# Patient Record
Sex: Female | Born: 1950 | Race: White | Hispanic: No | Smoking: Current every day smoker
Health system: Southern US, Community
[De-identification: ages and names within clinical notes are randomized; demographics above are authoritative.]

## PROBLEM LIST (undated history)

## (undated) DIAGNOSIS — F419 Anxiety disorder, unspecified: Secondary | ICD-10-CM

## (undated) DIAGNOSIS — M810 Age-related osteoporosis without current pathological fracture: Secondary | ICD-10-CM

## (undated) DIAGNOSIS — M791 Myalgia, unspecified site: Secondary | ICD-10-CM

## (undated) DIAGNOSIS — E785 Hyperlipidemia, unspecified: Secondary | ICD-10-CM

## (undated) DIAGNOSIS — J189 Pneumonia, unspecified organism: Secondary | ICD-10-CM

## (undated) DIAGNOSIS — M1990 Unspecified osteoarthritis, unspecified site: Secondary | ICD-10-CM

## (undated) DIAGNOSIS — I10 Essential (primary) hypertension: Secondary | ICD-10-CM

## (undated) DIAGNOSIS — K589 Irritable bowel syndrome without diarrhea: Secondary | ICD-10-CM

## (undated) DIAGNOSIS — K8020 Calculus of gallbladder without cholecystitis without obstruction: Secondary | ICD-10-CM

## (undated) DIAGNOSIS — M542 Cervicalgia: Secondary | ICD-10-CM

## (undated) DIAGNOSIS — G8929 Other chronic pain: Secondary | ICD-10-CM

## (undated) HISTORY — DX: Cervicalgia: M54.2

## (undated) HISTORY — PX: BLADDER SURGERY: SHX569

## (undated) HISTORY — DX: Pneumonia, unspecified organism: J18.9

## (undated) HISTORY — PX: CHOLECYSTECTOMY: SHX55

## (undated) HISTORY — DX: Age-related osteoporosis without current pathological fracture: M81.0

## (undated) HISTORY — DX: Essential (primary) hypertension: I10

## (undated) HISTORY — DX: Hyperlipidemia, unspecified: E78.5

## (undated) HISTORY — DX: Unspecified osteoarthritis, unspecified site: M19.90

## (undated) HISTORY — PX: VAGINAL HYSTERECTOMY: SUR661

## (undated) HISTORY — DX: Other chronic pain: G89.29

## (undated) HISTORY — DX: Anxiety disorder, unspecified: F41.9

## (undated) HISTORY — DX: Myalgia, unspecified site: M79.10

## (undated) HISTORY — DX: Calculus of gallbladder without cholecystitis without obstruction: K80.20

## (undated) HISTORY — DX: Irritable bowel syndrome, unspecified: K58.9

---

## 1998-05-17 ENCOUNTER — Observation Stay (HOSPITAL_COMMUNITY): Admission: EM | Admit: 1998-05-17 | Discharge: 1998-05-18 | Payer: Self-pay | Admitting: Emergency Medicine

## 1998-11-29 ENCOUNTER — Ambulatory Visit (HOSPITAL_COMMUNITY): Admission: RE | Admit: 1998-11-29 | Discharge: 1998-11-29 | Payer: Self-pay | Admitting: Gastroenterology

## 1998-11-29 ENCOUNTER — Encounter: Payer: Self-pay | Admitting: Gastroenterology

## 1999-01-15 ENCOUNTER — Encounter: Payer: Self-pay | Admitting: Emergency Medicine

## 1999-01-15 ENCOUNTER — Inpatient Hospital Stay (HOSPITAL_COMMUNITY): Admission: EM | Admit: 1999-01-15 | Discharge: 1999-01-17 | Payer: Self-pay | Admitting: Emergency Medicine

## 1999-01-16 ENCOUNTER — Encounter: Payer: Self-pay | Admitting: Internal Medicine

## 1999-05-16 ENCOUNTER — Ambulatory Visit (HOSPITAL_COMMUNITY): Admission: RE | Admit: 1999-05-16 | Discharge: 1999-05-16 | Payer: Self-pay | Admitting: Neurology

## 1999-07-02 ENCOUNTER — Encounter: Admission: RE | Admit: 1999-07-02 | Discharge: 1999-09-30 | Payer: Self-pay | Admitting: *Deleted

## 1999-10-02 ENCOUNTER — Encounter: Admission: RE | Admit: 1999-10-02 | Discharge: 1999-12-31 | Payer: Self-pay | Admitting: *Deleted

## 1999-12-31 ENCOUNTER — Encounter: Admission: RE | Admit: 1999-12-31 | Discharge: 2000-01-30 | Payer: Self-pay | Admitting: *Deleted

## 2000-12-29 ENCOUNTER — Encounter: Payer: Self-pay | Admitting: General Surgery

## 2000-12-29 ENCOUNTER — Encounter: Admission: RE | Admit: 2000-12-29 | Discharge: 2000-12-29 | Payer: Self-pay | Admitting: General Surgery

## 2001-03-09 ENCOUNTER — Other Ambulatory Visit: Admission: RE | Admit: 2001-03-09 | Discharge: 2001-03-09 | Payer: Self-pay | Admitting: Obstetrics and Gynecology

## 2002-05-05 ENCOUNTER — Encounter (INDEPENDENT_AMBULATORY_CARE_PROVIDER_SITE_OTHER): Payer: Self-pay

## 2002-05-05 ENCOUNTER — Ambulatory Visit (HOSPITAL_COMMUNITY): Admission: RE | Admit: 2002-05-05 | Discharge: 2002-05-05 | Payer: Self-pay | Admitting: Gastroenterology

## 2002-07-25 ENCOUNTER — Ambulatory Visit (HOSPITAL_COMMUNITY): Admission: RE | Admit: 2002-07-25 | Discharge: 2002-07-25 | Payer: Self-pay | Admitting: Specialist

## 2002-07-25 ENCOUNTER — Encounter: Payer: Self-pay | Admitting: Specialist

## 2002-10-05 ENCOUNTER — Ambulatory Visit (HOSPITAL_COMMUNITY): Admission: RE | Admit: 2002-10-05 | Discharge: 2002-10-05 | Payer: Self-pay | Admitting: Gastroenterology

## 2002-10-05 ENCOUNTER — Encounter: Payer: Self-pay | Admitting: Gastroenterology

## 2003-11-01 ENCOUNTER — Ambulatory Visit (HOSPITAL_COMMUNITY): Admission: RE | Admit: 2003-11-01 | Discharge: 2003-11-02 | Payer: Self-pay | Admitting: Obstetrics and Gynecology

## 2004-12-25 ENCOUNTER — Inpatient Hospital Stay (HOSPITAL_BASED_OUTPATIENT_CLINIC_OR_DEPARTMENT_OTHER): Admission: RE | Admit: 2004-12-25 | Discharge: 2004-12-25 | Payer: Self-pay | Admitting: Cardiology

## 2005-12-10 ENCOUNTER — Ambulatory Visit: Payer: Self-pay | Admitting: Gastroenterology

## 2006-06-18 ENCOUNTER — Encounter: Admission: RE | Admit: 2006-06-18 | Discharge: 2006-06-18 | Payer: Self-pay | Admitting: *Deleted

## 2007-11-10 ENCOUNTER — Encounter: Admission: RE | Admit: 2007-11-10 | Discharge: 2007-11-10 | Payer: Self-pay | Admitting: Family Medicine

## 2008-04-04 ENCOUNTER — Ambulatory Visit: Payer: Self-pay | Admitting: Internal Medicine

## 2008-05-04 ENCOUNTER — Ambulatory Visit: Payer: Self-pay | Admitting: Internal Medicine

## 2008-05-04 ENCOUNTER — Encounter: Payer: Self-pay | Admitting: Internal Medicine

## 2008-05-05 ENCOUNTER — Encounter: Payer: Self-pay | Admitting: Internal Medicine

## 2009-11-22 ENCOUNTER — Encounter: Admission: RE | Admit: 2009-11-22 | Discharge: 2009-11-22 | Payer: Self-pay | Admitting: Family Medicine

## 2009-12-03 ENCOUNTER — Encounter: Admission: RE | Admit: 2009-12-03 | Discharge: 2009-12-03 | Payer: Self-pay | Admitting: Family Medicine

## 2011-04-18 NOTE — Op Note (Signed)
NAME:  Heather Stevens, Heather Stevens                          ACCOUNT NO.:  [id number]   MEDICAL RECORD NO.:  [id number]                   PATIENT TYPE:  OBV   LOCATION:  9399                                 FACILITY:  [facility]   PHYSICIAN:  Zenaida Niece, M.D.             DATE OF BIRTH:  04/14/1951   DATE OF PROCEDURE:  11/01/2003  DATE OF DISCHARGE:                                 OPERATIVE REPORT   PREOPERATIVE DIAGNOSIS:  Stress urinary incontinence with cystocele and  possible rectocele.   POSTOPERATIVE DIAGNOSIS:  Stress urinary incontinence with minimal  cystocele.   PROCEDURE:  Tension-free vaginal tape.   SURGEON:  Zenaida Niece, M.D.   ASSISTANT:  Huel Cote, M.D.   ANESTHESIA:  Spinal and local.   ESTIMATED BLOOD LOSS:  50 mL.   FINDINGS:  She had no significant rectocele and a minimal cystocele.   PROCEDURE IN DETAIL:  The patient was taken to the operating room and placed  in the sitting position.  Quillian Quince, M.D., instilled spinal  anesthesia, and she was placed in the dorsal supine position.  Legs were  then placed in mobile stirrups with PAS hose in place.  The superior portion  of her pubic hair was then shaved and perineum was prepped and draped in the  usual sterile fashion and the bladder drained with a red rubber catheter.  A  thorough vaginal exam was carried out with the patient able to perform a  good Valsalva.  There was no significant rectocele and only a minimal  cystocele.  I thus decided just to proceed with the TVT.  Local anesthetic  with 1% lidocaine mixed with 0.5% Marcaine with epinephrine was instilled in  the suprapubic area along the path of the trocar as well as along the areas  of the vaginal incision.  Two stab incisions were then made just above the  pubic bone approximately 6 cm apart.  A 1.5 cm sagittal incision was made in  the vaginal wall approximately 1.5 cm from the urethral meatus.  The vaginal  mucosa was dissected off from  the underlying tissue laterally for 2-3 cm.  The TVT device was then inserted.  Using the vaginal needle, this was first  passed on the patient's right side, being careful to hug the pubic bone, and  then on the left side, being careful not to twist the TVT.  This was done  with minimal difficulty.  The handle that usually goes on the end of the  vaginal needles was not available, so I just did it without this handle.  The cystoscope was then inserted and the bladder was found to be completely  normal with no injury to the bladder.  The needles were pulled through and  were removed sharply.  The sling was then adjusted with Mayo scissors  beneath the urethral meatus.  The patient coughed and there was no  significant  leakage.  The TVT was made slightly looser with the curved Mayo  scissors to make sure that there would not be urinary retention.  The  patient was again asked to cough, and she did not leak.  The TVT sling was  then cut at the level of the suprapubic incisions.  The vaginal incision was  then closed with running locking 2-0 Vicryl and the suprapubic incisions  were closed with interrupted subcuticular sutures of 3-0 Vicryl, followed by  Steri-Strips and Band-Aids.  A Foley catheter was used to  drain the bladder but was then removed.  Two 4 x 4 sponges were then placed  in the vagina for a vaginal packing.  The patient tolerated the procedure  well and was taken to the recovery room in stable condition.  She was given  Ancef 1 g prior to the procedure and counts were correct.                                               Zenaida Niece, M.D.    TDM/MEDQ  D:  11/01/2003  T:  11/01/2003  Job:  [id number]

## 2011-04-18 NOTE — H&P (Signed)
NAME:  Heather Stevens, Heather Stevens                          ACCOUNT NO.:  [id number]   MEDICAL RECORD NO.:  [id number]                   PATIENT TYPE:  OBV   LOCATION:  NA                                   FACILITY:  [facility]   PHYSICIAN:  Zenaida Niece, M.D.             DATE OF BIRTH:  1951-05-29   DATE OF ADMISSION:  11/01/2003  DATE OF DISCHARGE:                                HISTORY & PHYSICAL   CHIEF COMPLAINT:  Stress urinary incontinence and pelvic relaxation.   HISTORY OF PRESENT ILLNESS:  This is a 60 year old white female, para 2-0-1-  2, whom I first saw in April 2002 for a yearly exam.  At that time she had a  small cystocele and rectocele and a poor Kegel.  She tried conservative  therapy with hormone replacement therapy and Kegel exercises.  Her next  annual exam was in October 2004.  At that time she complained of some  persistent hot flashes and night sweats, and at that time she had been off  her estrogen for quite some time.  She frequently leaked urine with activity  with occasional urgency and nocturia.  On exam she had a grade 1 cystocele  and rectocele and symptoms consistent with stress urinary incontinence.  She  was seen in the office on November 29 and with office cystometrics  demonstrated a normal postvoid residual, normal bladder volume, and a  positive leak while standing with a full bladder.  Her hot flashes have been  controlled with Premarin 0.3 mg.  Due to her cystocele, rectocele, and  stress incontinence, options have been discussed with the patient and she  wishes to proceed with surgical therapy.   PAST OBSTETRICAL HISTORY:  One vaginal delivery and one cesarean section,  both at 36 weeks without complications. One spontaneous abortion with a D&C.   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   PAST SURGICAL HISTORY:  Cholecystectomy, breast biopsy x2,  laparoscopy with  adhesiolysis and endometriosis, vaginal hysterectomy in 1980 for bleeding.  She has also had the prior cesarean section with tubal ligation.   ALLERGIES:  SULFA, FENTANYL, TAPE, and IODINE.   CURRENT MEDICATIONS:  1. Methadone.  2. Zanaflex.  3. Amitriptyline.  4. Zoloft.  5. Advil p.r.n.   SOCIAL HISTORY:  She is married.  Does smoke about a half-pack of cigarettes  a day.  Denies significant alcohol use.   FAMILY HISTORY:  She has a paternal aunt with breast cancer and a maternal  grandfather with colon cancer.   PHYSICAL EXAMINATION:  VITAL SIGNS:  Weight is approximately 110 pounds.  Blood pressure 122/82, pulse 90.  GENERAL:  She is a thin white female in no acute distress.  NECK:  Supple without lymphadenopathy or thyromegaly.  CHEST:  Lungs clear to auscultation.  CARDIAC:  Regular rate and  rhythm without murmur.  ABDOMEN:  Soft, nontender, nondistended, without palpable masses.  She does  have a transverse scar.  BREASTS:  Breast exam in the sitting and supine position reveals no dominant  masses, adenopathy, skin change, or nipple discharge.  EXTREMITIES:  No edema.  PELVIC:  External genitalia has no lesions.  On speculum exam she has a  normal vaginal cuff and she does have a grade 1 cystocele and rectocele.  On  bimanual and rectovaginal exams there are no masses.   ASSESSMENT:  Symptomatic pelvic relaxation with stress urinary incontinence.  Stress incontinence has been documented by office cystometrics.  All options  have been discussed with the patient, and she wishes to proceed with  surgical therapy.  Risks of surgery including bleeding, infection, and  damage to bowels and bladder have been discussed with the patient, and she  agrees to proceed.   PLAN:  Admit the patient for anterior and posterior colporrhaphy and a TVT.                                               Zenaida Niece, M.D.    TDM/MEDQ  D:  10/31/2003  T:  10/31/2003   Job:  [id number]

## 2011-04-18 NOTE — H&P (Signed)
NAME:  Heather Stevens, Heather Stevens                ACCOUNT NO.:  [id number]   MEDICAL RECORD NO.:  [id number]          PATIENT TYPE:  AMB   LOCATION:                               FACILITY:  [facility]   PHYSICIAN:  Sharlee Blew, N.P.  DATE OF BIRTH:  July 07, 1951   DATE OF ADMISSION:  12/25/2004  DATE OF DISCHARGE:                                HISTORY & PHYSICAL   CHIEF COMPLAINT:  Atypical chest pain.   HISTORY OF PRESENT ILLNESS:  Heather Stevens is a pleasant 59 year old white  female who has a history of ongoing tobacco abuse.  She was referred to our  office towards the middle part of January with complaints of chest pain.  She has had a history of left shoulder discomfort as well as intermittent  episodes of actual chest fullness that radiate up into the neck.  Has been  recurrent in nature and has been associated with a generalized feeling of  weakness.  She has also had a burning-type sensation when she tries to walk  on a treadmill and also has periods where she feels like, lightening bolt  shoots through her chest.  She does have a history of an abnormal 12-lead  electrocardiogram with poor R wave progression.  She subsequently is  referred for elective cardiac catheterization.   PAST MEDICAL HISTORY:  1.  History of PVC's.  2.  Myofascial pain, chronic.  3.  Ongoing tobacco abuse.  4.  Family history of heart disease.  5.  Hyperlipidemia.   PAST SURGICAL HISTORY:  Cesarean section in 1976, vaginal hysterectomy in  1980, cholecystectomy in 1998, history of remote laparoscopic surgery in  1985, history of bladder tack in 2005 and dental surgery in 2005.   ALLERGIES:  Sulfa and iodine.   CURRENT MEDICATIONS:  1.  Methadone 5 mg b.i.d.  2.  Zanaflex 4 mg t.i.d.  3.  Elavil at bedtime.  4.  Advil four to six tablets a day.  5.  Lipitor 20 mg a day.   FAMILY HISTORY:  Father died at 25 with a heart attack.  Mother is alive at  age 21.   SOCIAL HISTORY:  She is married.  She is retired  on Disability due to a  chronic pain syndrome.  She does smoke a half a pack of cigarettes per day  and has rare alcohol use.   REVIEW OF SYSTEMS:  Basically as noted above and is otherwise unremarkable.   PHYSICAL EXAMINATION:  GENERAL: On exam, she is a pleasant 60 year old  female.  VITAL SIGNS:  Weight is 104 pounds, blood pressure is 140/60 sitting, 120/70  standing, heart rate is 84.  HEENT:  Unremarkable.  NECK:  Supple without bruits.  There is fullness of the musculoskeletal in  the left neck noted.  LUNGS:  Reasonably clear.  CHEST:  Chest wall is nontender.  CARDIAC:  Exam shows a regular rhythm without murmur.  BREASTS:  Not examined.  ABDOMEN:  Soft, positive bowel sounds.  Nontender.  EXTREMITIES:  Without edema.  NEUROLOGICAL:  Intact.  There are no gross focal deficits.  PERTINENT LABORATORIES:  Pending.   OVERALL IMPRESSION:  1.  Recurrent chest pain.  2.  Chronic pain syndrome due to myofascial disorder requiring chronic      narcotic use.  3.  Ongoing tobacco abuse.  4.  Hyperlipidemia.  5.  Positive family history of heart disease.   PLAN:  Will proceed on with cardiac catheterization.  That is felt to be the  best reasonable approach to discern her overall cardiovascular risk.  She  has been encouraged to stop smoking.  The procedure including the risks and  benefits have all been explained and she is willing to proceed on Wednesday,  December 25, 2004.                                               ______________________________  Sharlee Blew, N.P.    LC/MEDQ  D:  12/23/2004  T:  12/23/2004  Job:  [id number]   cc:   [facility]

## 2011-04-18 NOTE — Cardiovascular Report (Signed)
NAME:  Heather Stevens, Heather Stevens                ACCOUNT NO.:  [id number]   MEDICAL RECORD NO.:  [id number]          PATIENT TYPE:  OIB   LOCATION:  6501                         FACILITY:  [facility]   PHYSICIAN:  Colleen Can. 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  She has had burning sensation when she tries to walk  on her treadmill and has had poor R-wave progression across her anterior  precordium of her EKG with left atrial enlargement.  Because of these  abnormalities and the persistence and recurrence of pain she is referred for  catheterization.   PROCEDURE:  Left heart catheterization with selective coronary angiography,  left ventricular angiography.   TYPE AND SITE OF ENTRY:  Percutaneous, right femoral artery.   CATHETERS:  4 curved Judkins right and left coronary catheters, 4-French in  size.   CONTRAST MATERIAL:  Omnipaque.   MEDICATIONS GIVEN PRIOR TO THE PROCEDURE:  Valium 10 mg p.o.   MEDICATIONS GIVEN DURING THE PROCEDURE:  Versed 2 mg IV.   COMMENTS:  The patient tolerated the procedure well.   HEMODYNAMIC DATA:  1.  The aortic pressure was 145/73.  2.  LV was 129/3-11.  3.  There was no aortic valve gradient noted on pullback.   ANGIOGRAPHIC DATA:  1.  Left main coronary artery is normal.  2.  Left anterior descending is normal.  There is a high diagonal vessel      that is a moderate size that bifurcates distally.  The left anterior      descending bifurcates with a large second diagonal vessel in a distal      portion and continues with the left anterior descending and this second      diagonal being almost of equal size.  3.  Left circumflex continues as a moderately large obtuse  marginal.  There      is irregularity proximally but when reviewed in multiple views it      appears to be more of an area of tortuosity in the vessel.  There is no      obstructive coronary disease present.  4.  Right coronary artery is a large, dominant vessel.  It has a large      continuation branch to the posterolateral wall.  There is a large      posterior descending vessel.  It is normal.   LEFT VENTRICULOGRAM:  Left ventricular angiogram is performed in the RAO  position.  Overall cardiac size and silhouette are normal.  Global ejection  fraction is 55-60%.  Regional wall motion is normal.   OVERALL IMPRESSION:  1.  Normal left ventricular function.  2.  Normal coronary arteries with slight tortuosity in the proximal left      circumflex.   DISCUSSION:  It is felt that [name]'s chest pain is not  coronary in  nature, but more likely to be related to her chronic degenerative neck  disease.      SNT/MEDQ  D:  12/25/2004  T:  12/25/2004  Job:  [id number]   cc:   [facility]

## 2011-09-09 ENCOUNTER — Other Ambulatory Visit: Payer: Self-pay | Admitting: Family Medicine

## 2011-09-12 ENCOUNTER — Other Ambulatory Visit: Payer: Self-pay

## 2011-09-15 ENCOUNTER — Ambulatory Visit
Admission: RE | Admit: 2011-09-15 | Discharge: 2011-09-15 | Disposition: A | Discharge status: Home / Self Care | Payer: Medicare Other | Source: Ambulatory Visit | Attending: Family Medicine | Admitting: Family Medicine

## 2011-09-17 ENCOUNTER — Other Ambulatory Visit: Payer: Self-pay | Admitting: Family Medicine

## 2011-09-17 DIAGNOSIS — K7689 Other specified diseases of liver: Secondary | ICD-10-CM

## 2011-09-20 ENCOUNTER — Other Ambulatory Visit: Payer: Medicare Other

## 2011-09-23 ENCOUNTER — Other Ambulatory Visit: Payer: Medicare Other

## 2011-09-25 ENCOUNTER — Ambulatory Visit
Admission: RE | Admit: 2011-09-25 | Discharge: 2011-09-25 | Disposition: A | Discharge status: Home / Self Care | Payer: Medicare Other | Source: Ambulatory Visit | Attending: Family Medicine | Admitting: Family Medicine

## 2011-09-25 DIAGNOSIS — K7689 Other specified diseases of liver: Secondary | ICD-10-CM

## 2011-09-25 MED ORDER — GADOBENATE DIMEGLUMINE 529 MG/ML IV SOLN
10.0000 mL | Freq: Once | INTRAVENOUS | Status: AC | PRN
  Administered 2011-09-25: 10 mL via INTRAVENOUS

## 2011-11-13 ENCOUNTER — Other Ambulatory Visit: Payer: Self-pay | Admitting: Physician Assistant

## 2011-11-13 DIAGNOSIS — Z1231 Encounter for screening mammogram for malignant neoplasm of breast: Secondary | ICD-10-CM

## 2011-12-10 ENCOUNTER — Other Ambulatory Visit: Payer: Medicare Other

## 2011-12-10 ENCOUNTER — Ambulatory Visit: Payer: Medicare Other

## 2011-12-12 ENCOUNTER — Ambulatory Visit
Admission: RE | Admit: 2011-12-12 | Discharge: 2011-12-12 | Disposition: A | Discharge status: Home / Self Care | Payer: Medicare Other | Source: Ambulatory Visit | Attending: Physician Assistant | Admitting: Physician Assistant

## 2011-12-12 DIAGNOSIS — Z1231 Encounter for screening mammogram for malignant neoplasm of breast: Secondary | ICD-10-CM

## 2013-04-07 ENCOUNTER — Telehealth: Payer: Self-pay | Admitting: Internal Medicine

## 2013-04-07 NOTE — Telephone Encounter (Signed)
Pt states she is having abdominal pain that goes around her left side and up in her chest. States her abdomen is swollen in that area. Reports having some diarrhea also. Pt requests to be seen sooner than 1st available. Pt scheduled to see Mike Gip PA Monday 04/11/13 at 11am. Pt aware of appt date and time.

## 2013-04-11 ENCOUNTER — Other Ambulatory Visit (INDEPENDENT_AMBULATORY_CARE_PROVIDER_SITE_OTHER): Payer: Medicare Other

## 2013-04-11 ENCOUNTER — Ambulatory Visit (INDEPENDENT_AMBULATORY_CARE_PROVIDER_SITE_OTHER): Payer: Medicare Other | Admitting: Physician Assistant

## 2013-04-11 ENCOUNTER — Encounter: Payer: Self-pay | Admitting: Physician Assistant

## 2013-04-11 VITALS — BP 110/60 | HR 76 | Ht 62.0 in | Wt 120.2 lb

## 2013-04-11 DIAGNOSIS — R140 Abdominal distension (gaseous): Secondary | ICD-10-CM

## 2013-04-11 DIAGNOSIS — Z86010 Personal history of colonic polyps: Secondary | ICD-10-CM

## 2013-04-11 DIAGNOSIS — F1120 Opioid dependence, uncomplicated: Secondary | ICD-10-CM | POA: Insufficient documentation

## 2013-04-11 DIAGNOSIS — R141 Gas pain: Secondary | ICD-10-CM

## 2013-04-11 DIAGNOSIS — Z9049 Acquired absence of other specified parts of digestive tract: Secondary | ICD-10-CM

## 2013-04-11 DIAGNOSIS — E782 Mixed hyperlipidemia: Secondary | ICD-10-CM | POA: Insufficient documentation

## 2013-04-11 DIAGNOSIS — F1920 Other psychoactive substance dependence, uncomplicated: Secondary | ICD-10-CM

## 2013-04-11 DIAGNOSIS — R1013 Epigastric pain: Secondary | ICD-10-CM

## 2013-04-11 DIAGNOSIS — E785 Hyperlipidemia, unspecified: Secondary | ICD-10-CM

## 2013-04-11 DIAGNOSIS — Z9089 Acquired absence of other organs: Secondary | ICD-10-CM

## 2013-04-11 DIAGNOSIS — Z860101 Personal history of adenomatous and serrated colon polyps: Secondary | ICD-10-CM

## 2013-04-11 DIAGNOSIS — G8929 Other chronic pain: Secondary | ICD-10-CM

## 2013-04-11 DIAGNOSIS — K589 Irritable bowel syndrome without diarrhea: Secondary | ICD-10-CM

## 2013-04-11 DIAGNOSIS — M549 Dorsalgia, unspecified: Secondary | ICD-10-CM

## 2013-04-11 LAB — CBC WITH DIFFERENTIAL/PLATELET
Basophils Absolute: 0 10*3/uL (ref 0.0–0.1)
Eosinophils Relative: 1.7 % (ref 0.0–5.0)
HCT: 41.4 % (ref 36.0–46.0)
Lymphocytes Relative: 32.5 % (ref 12.0–46.0)
MCHC: 35.1 g/dL (ref 30.0–36.0)
MCV: 83.6 fl (ref 78.0–100.0)
Neutrophils Relative %: 60.2 % (ref 43.0–77.0)

## 2013-04-11 LAB — COMPREHENSIVE METABOLIC PANEL
ALT: 25 U/L (ref 0–35)
Albumin: 3.9 g/dL (ref 3.5–5.2)
BUN: 13 mg/dL (ref 6–23)
Calcium: 9.3 mg/dL (ref 8.4–10.5)
Creatinine, Ser: 0.9 mg/dL (ref 0.4–1.2)
Total Bilirubin: 0.5 mg/dL (ref 0.3–1.2)
Total Protein: 7.4 g/dL (ref 6.0–8.3)

## 2013-04-11 MED ORDER — MOVIPREP 100 G PO SOLR: 1.0000 | Freq: Once | ORAL | Status: DC

## 2013-04-11 MED ORDER — OMEPRAZOLE 20 MG PO CPDR: DELAYED_RELEASE_CAPSULE | ORAL | Status: DC

## 2013-04-11 NOTE — Patient Instructions (Addendum)
You have been given a separate informational sheet regarding your tobacco use, the importance of quitting and local resources to help you quit. We have given you a Low Gas Diet.   Please go to the basement level to have your labs drawn. 5  We sent a prescription for the Prilosec OTC.    You have been scheduled for a CT scan of the abdomen and pelvis at [facility] ([address]---this is in the same building as Architectural technologist).   You are scheduled on 5-13-2014at 11;30 am . You should arrive at 11:15 AM. Prior to your appointment time for registration. Please follow the written instructions below on the day of your exam:  WARNING: IF YOU ARE ALLERGIC TO IODINE/X-RAY DYE, PLEASE NOTIFY RADIOLOGY IMMEDIATELY AT [phone]! YOU WILL BE GIVEN A 13 HOUR PREMEDICATION PREP.  1) Do not eat or drink anything after 7:30 am (4 hours prior to your test) 2) You have been given 2 bottles of oral contrast to drink. The solution may taste  better if refrigerated, but do NOT add ice or any other liquid to this solution. Shake well before drinking.    Drink 1 bottle of contrast @9 :30 am  (2 hours prior to your exam)  Drink 1 bottle of contrast @ 10:30 am  (1 hour prior to your exam)  You may take any medications as prescribed with a small amount of water except for the following: Metformin, Glucophage, Glucovance, Avandamet, Riomet, Fortamet, Actoplus Met, Janumet, Glumetza or Metaglip. The above medications must be held the day of the exam AND 48 hours after the exam.  The purpose of you drinking the oral contrast is to aid in the visualization of your intestinal tract. The contrast solution may cause some diarrhea. Before your exam is started, you will be given a small amount of fluid to drink. Depending on your individual set of symptoms, you may also receive an intravenous injection of x-ray contrast/dye. Plan on being at [facility] for 30 minutes or long, depending on the type of  exam you are having performed.  If you have any questions regarding your exam or if you need to reschedule, you may call the CT department at [phone] between the hours of 8:00 am and 5:00 pm, Monday-Friday.  ________________________________________________________________________

## 2013-04-11 NOTE — Progress Notes (Signed)
Agree with initial assessment and plans. May need upper endoscopy if pain persists and other entities unrevealing.

## 2013-04-11 NOTE — Progress Notes (Signed)
Subjective:    Patient ID: Heather Stevens, female    DOB: 1950-12-30, 62 y.o.   MRN: [id number]  HPI Heather Stevens is a very nice 62 year old white female long term patient of Dr. Donaciano Eva and more recently to Dr. Yancey Flemings from colonoscopy in 2009. She had a 2 mm polyp in the right colon removed which was a tubular adenoma and is due for followup. She has history of IBS which had been diarrhea predominant and has had chronic problems with back pain for which she is now maintained on methadone. She says she's actually doing better from an IBS standpoint that she does not have chronic diarrhea problems that she used to have. He comes in today as of onset of upper abdominal pain about one week ago. Pain is in the upper abdomen and radiates around into her back on the left side. She says it has been constant and was fairly severe 445 days now gradually improving. She says this was associated with a lot of gas and bloating no change in her bowel habits melena or hematochezia. She's not had any documented fever or chills, she has had some nausea but no vomiting the Her only new medication is Robaxin which she started 6 or 7 weeks ago and Decreased the dose about a week ago. She is not taking any regular aspirin or NSAIDs. Review of her records show that she did have an MRI of the abdomen done in October of 2012 which did mention a pancreas divisum.    Review of Systems  Constitutional: Negative.   HENT: Negative.   Eyes: Negative.   Respiratory: Negative.   Cardiovascular: Negative.   Gastrointestinal: Positive for abdominal pain and abdominal distention.  Endocrine: Negative.   Genitourinary: Negative.   Allergic/Immunologic: Negative.   Neurological: Negative.   Hematological: Negative.   Psychiatric/Behavioral: Negative.    Outpatient Encounter Prescriptions as of 04/11/2013  Medication Sig Dispense Refill  . alendronate (FOSAMAX) 70 MG tablet Take 70 mg by mouth every 7 (seven) days. Take with a  full glass of water on an empty stomach.      Marland Kitchen amitriptyline (ELAVIL) 25 MG tablet Take 25 mg by mouth at bedtime.      Marland Kitchen aspirin 81 MG tablet Take 81 mg by mouth daily.      Marland Kitchen atorvastatin (LIPITOR) 20 MG tablet Take 20 mg by mouth daily.      . Calcium Carbonate-Vitamin D (CALTRATE 600+D) 600-400 MG-UNIT per chew tablet Chew 1 tablet by mouth 3 (three) times daily with meals.      . citalopram (CELEXA) 10 MG tablet Take 10 mg by mouth daily.      . methadone (DOLOPHINE) 5 MG tablet Take 5 mg by mouth 2 (two) times daily.      . methocarbamol (ROBAXIN) 500 MG tablet Take 500 mg by mouth 4 (four) times daily.      . metoprolol succinate (TOPROL-XL) 50 MG 24 hr tablet Take 25 mg by mouth daily. Take with or immediately following a meal.      . MOVIPREP 100 G SOLR Take 1 kit (100 g total) by mouth once. "Pharmacist please use BIN: [id number] GROUP: [id number] ID: [id number] Call [phone] for pharmacy questions "Pt will save $10"  1 kit  0  . omeprazole (PRILOSEC) 20 MG capsule Take 1 tab 30 min before breakfast.  30 capsule  1   No facility-administered encounter medications on file as of 04/11/2013.   Allergies  Allergen Reactions  .  Iodine   . Pravastatin   . Sulfa Antibiotics   . Zanaflex (Tizanidine)    Patient Active Problem List   Diagnosis Date Noted  . IBS (irritable bowel syndrome) 04/11/2013  . Chronic narcotic dependence 04/11/2013  . Chronic back pain 04/11/2013  . S/P cholecystectomy 04/11/2013  . Hx of adenomatous colonic polyps 04/11/2013  . Other and unspecified hyperlipidemia 04/11/2013   History  Substance Use Topics  . Smoking status: Current Every Day Smoker    Types: Cigarettes  . Smokeless tobacco: Never Used     Comment: e-sig  . Alcohol Use: No   family history includes Breast cancer in her paternal aunt; Colon polyps in her paternal grandmother; Heart attack in her father and mother; and Heart disease in her maternal grandmother, paternal  grandfather, and paternal grandmother.      Objective:   Physical Exam well-developed white female in no acute distress, pleasant blood pressure 110/60 pulse 76 height 5 foot 2 weight 120. HEENT; nontraumatic normocephalic EOMI PERRLA sclera anicteric,Neck; Supple no JVD, Cardiovascular; regular rate and rhythm with S1-S2 no murmur or gallop, Pulmonary; clear bilaterally, Abdomen ;soft she is tender across the upper abdomen and in the epigastrium is no palpable mass or hepatosplenomegaly no guarding or rebound bowel sounds are present, Rectal; exam not done, Extremities; no clubbing cyanosis or edema skin warm and dry, Psych; mood and affect normal and appropriate        Assessment & Plan:  #65 62 year old female with history of adenomatous colon polyps due for followup colonoscopy. #2  1 week history of new upper abdominal pain radiating around into the left back and associated with bloating and gas. This is not consistent with her previous IBS symptoms, will rule out a mild pancreatitis, gastropathy or peptic ulcer disease. #3 history of IBS review is [name] diarrhea predominant improved since on chronic methadone #4 narcotic dependence secondary to chronic back pain #5 status post cholecystectomy  Plan; will check CBC with differential ,CMET, and lipase today Schedule CT scan of the abdomen and pelvis with oral but no IV contrast due to contrast allergy Add Prilosec 20 mg by mouth every morning Gas-X before each meal Schedule for colonoscopy with Dr. Garner Gavel was discussed in detail and patient is agreeable to proceed.

## 2013-04-12 ENCOUNTER — Ambulatory Visit (INDEPENDENT_AMBULATORY_CARE_PROVIDER_SITE_OTHER)
Admission: RE | Admit: 2013-04-12 | Discharge: 2013-04-12 | Disposition: A | Discharge status: Home / Self Care | Payer: Medicare Other | Source: Ambulatory Visit | Attending: Physician Assistant | Admitting: Physician Assistant

## 2013-04-12 ENCOUNTER — Telehealth: Payer: Self-pay | Admitting: Internal Medicine

## 2013-04-12 DIAGNOSIS — Z86010 Personal history of colonic polyps: Secondary | ICD-10-CM

## 2013-04-12 DIAGNOSIS — R143 Flatulence: Secondary | ICD-10-CM

## 2013-04-12 DIAGNOSIS — R1013 Epigastric pain: Secondary | ICD-10-CM

## 2013-04-12 DIAGNOSIS — R140 Abdominal distension (gaseous): Secondary | ICD-10-CM

## 2013-04-12 NOTE — Telephone Encounter (Signed)
Pt states she cannot drink the contrast. States she drank 3/4 of the bottle and threw up. Call placed to [facility] and they are going to call the pt and discuss options with her. Pt might be able to drink the water based contrast. Message left for pt that she will be receiving a call from [facility].

## 2013-05-02 ENCOUNTER — Ambulatory Visit (AMBULATORY_SURGERY_CENTER): Payer: Medicare Other | Admitting: Internal Medicine

## 2013-05-02 ENCOUNTER — Encounter: Payer: Self-pay | Admitting: Internal Medicine

## 2013-05-02 ENCOUNTER — Encounter: Payer: Medicare Other | Admitting: Internal Medicine

## 2013-05-02 VITALS — BP 107/63 | HR 61 | Temp 97.2°F | Resp 13 | Ht 62.0 in | Wt 120.0 lb

## 2013-05-02 DIAGNOSIS — Z1211 Encounter for screening for malignant neoplasm of colon: Secondary | ICD-10-CM

## 2013-05-02 DIAGNOSIS — D126 Benign neoplasm of colon, unspecified: Secondary | ICD-10-CM

## 2013-05-02 DIAGNOSIS — Z86010 Personal history of colonic polyps: Secondary | ICD-10-CM

## 2013-05-02 DIAGNOSIS — K633 Ulcer of intestine: Secondary | ICD-10-CM

## 2013-05-02 MED ORDER — SODIUM CHLORIDE 0.9 % IV SOLN: 500.0000 mL | INTRAVENOUS | Status: DC

## 2013-05-02 NOTE — Progress Notes (Signed)
Patient did not experience any of the following events: a burn prior to discharge; a fall within the facility; wrong site/side/patient/procedure/implant event; or a hospital transfer or hospital admission upon discharge from the facility. (G8907) Patient did not have preoperative order for IV antibiotic SSI prophylaxis. (G8918)  

## 2013-05-02 NOTE — Progress Notes (Signed)
Procedure ends, to recovery, report given, VSS. 

## 2013-05-02 NOTE — Op Note (Signed)
[facility] [address] [place], [address]   COLONOSCOPY PROCEDURE REPORT  PATIENT: Heather, Stevens  MR#: [id number] BIRTHDATE: August 19, 1951 , 62  yrs. old GENDER: Female ENDOSCOPIST: Roxy Cedar, MD REFERRED [id number] Program Recall PROCEDURE DATE:  05/02/2013 PROCEDURE:   Colonoscopy with snare polypectomy x 1 and Colonoscopy with biopsy ASA CLASS:   Class II INDICATIONS:Patient's personal history of adenomatous colon polyps.  MEDICATIONS: MAC sedation, administered by CRNA and propofol (Diprivan) 280mg  IV  DESCRIPTION OF PROCEDURE:   After the risks benefits and alternatives of the procedure were thoroughly explained, informed consent was obtained.  A digital rectal exam revealed decreased sphincter tone.   The LB [id number]  endoscope was introduced through the anus and advanced to the cecum, which was identified by both the appendix and ileocecal valve. No adverse events experienced.   The quality of the prep was good, using MoviPrep The instrument was then slowly withdrawn as the colon was fully examined.      COLON FINDINGS: A  hyperemic diminutive polyp was found in the ascending colon.  A polypectomy was performed with a cold snare. The resection was complete and the polyp tissue was completely retrieved. There was nonspecific focal ulceration in 2 discrete areas in the ascending colon. This was biopsied   The colon was otherwise normal.  There was no diverticulosis,  other polyps or cancers.  Retroflexed views revealed no abnormalities. The time to cecum=2 minutes 43 seconds.  Withdrawal time=12 minutes 56 seconds. The scope was withdrawn and the procedure completed. COMPLICATIONS: There were no complications.  ENDOSCOPIC IMPRESSION: 1.   Diminutive polyp was found in the ascending colon; polypectomy was performed with a cold snare 2.    Nonspecific small ulcer right colon.The colon was  otherwise normal  RECOMMENDATIONS: 1. Repeat colonoscopy in 5 years if polyp adenomatous; otherwise 10 years   eSigned:  Roxy Cedar, MD 05/02/2013 4:11 PM   cc: Lonie Peak, PA and The Patient   PATIENT NAME:  Heather, Stevens MR#: [id number]

## 2013-05-02 NOTE — Patient Instructions (Addendum)
YOU HAD AN ENDOSCOPIC PROCEDURE TODAY AT THE [facility]: Refer to the procedure report that was given to you for any specific questions about what was found during the examination.  If the procedure report does not answer your questions, please call your gastroenterologist to clarify.  If you requested that your care partner not be given the details of your procedure findings, then the procedure report has been included in a sealed envelope for you to review at your convenience later.  YOU SHOULD EXPECT: Some feelings of bloating in the abdomen. Passage of more gas than usual.  Walking can help get rid of the air that was put into your GI tract during the procedure and reduce the bloating. If you had a lower endoscopy (such as a colonoscopy or flexible sigmoidoscopy) you may notice spotting of blood in your stool or on the toilet paper. If you underwent a bowel prep for your procedure, then you may not have a normal bowel movement for a few days.  DIET: Your first meal following the procedure should be a light meal and then it is ok to progress to your normal diet.  A half-sandwich or bowl of soup is an example of a good first meal.  Heavy or fried foods are harder to digest and may make you feel nauseous or bloated.  Likewise meals heavy in dairy and vegetables can cause extra gas to form and this can also increase the bloating.  Drink plenty of fluids but you should avoid alcoholic beverages for 24 hours.  ACTIVITY: Your care partner should take you home directly after the procedure.  You should plan to take it easy, moving slowly for the rest of the day.  You can resume normal activity the day after the procedure however you should NOT DRIVE or use heavy machinery for 24 hours (because of the sedation medicines used during the test).    SYMPTOMS TO REPORT IMMEDIATELY: A gastroenterologist can be reached at any hour.  During normal business hours, 8:30 AM to 5:00 PM Monday through Friday,  call [phone].  After hours and on weekends, please call the GI answering service at [phone] who will take a message and have the physician on call contact you.   Following lower endoscopy (colonoscopy or flexible sigmoidoscopy):  Excessive amounts of blood in the stool  Significant tenderness or worsening of abdominal pains  Swelling of the abdomen that is new, acute  Fever of 100F or higher  FOLLOW UP: If any biopsies were taken you will be contacted by phone or by letter within the next 1-3 weeks.  Call your gastroenterologist if you have not heard about the biopsies in 3 weeks.  Our staff will call the home number listed on your records the next business day following your procedure to check on you and address any questions or concerns that you may have at that time regarding the information given to you following your procedure. This is a courtesy call and so if there is no answer at the home number and we have not heard from you through the emergency physician on call, we will assume that you have returned to your regular daily activities without incident.  SIGNATURES/CONFIDENTIALITY: You and/or your care partner have signed paperwork which will be entered into your electronic medical record.  These signatures attest to the fact that that the information above on your After Visit Summary has been reviewed and is understood.  Full responsibility of the confidentiality of this   discharge information lies with you and/or your care-partner.  Polyps-handout given  Repeat colonoscopy will be determined by pathology   

## 2013-05-02 NOTE — Progress Notes (Signed)
Called to room to assist during endoscopic procedure.  Patient ID and intended procedure confirmed with present staff. Received instructions for my participation in the procedure from the performing physician.  

## 2013-05-03 ENCOUNTER — Telehealth: Payer: Self-pay | Admitting: *Deleted

## 2013-05-03 NOTE — Telephone Encounter (Signed)
Name identifier, left message, follow-up 

## 2013-05-09 ENCOUNTER — Encounter: Payer: Self-pay | Admitting: Internal Medicine

## 2013-12-12 DIAGNOSIS — M5412 Radiculopathy, cervical region: Secondary | ICD-10-CM | POA: Diagnosis not present

## 2014-02-14 DIAGNOSIS — M5412 Radiculopathy, cervical region: Secondary | ICD-10-CM | POA: Diagnosis not present

## 2014-02-14 DIAGNOSIS — Z79899 Other long term (current) drug therapy: Secondary | ICD-10-CM | POA: Diagnosis not present

## 2014-04-07 DIAGNOSIS — I10 Essential (primary) hypertension: Secondary | ICD-10-CM | POA: Diagnosis not present

## 2014-04-07 DIAGNOSIS — E559 Vitamin D deficiency, unspecified: Secondary | ICD-10-CM | POA: Diagnosis not present

## 2014-04-07 DIAGNOSIS — F411 Generalized anxiety disorder: Secondary | ICD-10-CM | POA: Diagnosis not present

## 2014-04-07 DIAGNOSIS — E7800 Pure hypercholesterolemia, unspecified: Secondary | ICD-10-CM | POA: Diagnosis not present

## 2014-04-07 DIAGNOSIS — M5000 Cervical disc disorder with myelopathy, unspecified cervical region: Secondary | ICD-10-CM | POA: Diagnosis not present

## 2014-04-07 DIAGNOSIS — Z79899 Other long term (current) drug therapy: Secondary | ICD-10-CM | POA: Diagnosis not present

## 2014-04-25 DIAGNOSIS — M25519 Pain in unspecified shoulder: Secondary | ICD-10-CM | POA: Diagnosis not present

## 2014-04-25 DIAGNOSIS — IMO0001 Reserved for inherently not codable concepts without codable children: Secondary | ICD-10-CM | POA: Diagnosis not present

## 2014-04-25 DIAGNOSIS — Z79899 Other long term (current) drug therapy: Secondary | ICD-10-CM | POA: Diagnosis not present

## 2014-04-25 DIAGNOSIS — M542 Cervicalgia: Secondary | ICD-10-CM | POA: Diagnosis not present

## 2014-04-27 ENCOUNTER — Other Ambulatory Visit: Payer: Self-pay | Admitting: Physician Assistant

## 2014-04-27 DIAGNOSIS — Z1231 Encounter for screening mammogram for malignant neoplasm of breast: Secondary | ICD-10-CM

## 2014-04-27 DIAGNOSIS — M810 Age-related osteoporosis without current pathological fracture: Secondary | ICD-10-CM

## 2014-05-10 ENCOUNTER — Other Ambulatory Visit: Payer: Medicare Other

## 2014-05-10 ENCOUNTER — Ambulatory Visit: Payer: Medicare Other

## 2014-07-03 DIAGNOSIS — B029 Zoster without complications: Secondary | ICD-10-CM | POA: Diagnosis not present

## 2014-07-06 ENCOUNTER — Encounter: Payer: Self-pay | Admitting: Internal Medicine

## 2014-07-25 DIAGNOSIS — M542 Cervicalgia: Secondary | ICD-10-CM | POA: Diagnosis not present

## 2014-07-25 DIAGNOSIS — M25519 Pain in unspecified shoulder: Secondary | ICD-10-CM | POA: Diagnosis not present

## 2014-07-25 DIAGNOSIS — IMO0001 Reserved for inherently not codable concepts without codable children: Secondary | ICD-10-CM | POA: Diagnosis not present

## 2014-07-25 DIAGNOSIS — Z79899 Other long term (current) drug therapy: Secondary | ICD-10-CM | POA: Diagnosis not present

## 2014-10-10 DIAGNOSIS — E780 Pure hypercholesterolemia: Secondary | ICD-10-CM | POA: Diagnosis not present

## 2014-10-10 DIAGNOSIS — M5003 Cervical disc disorder with myelopathy, cervicothoracic region: Secondary | ICD-10-CM | POA: Diagnosis not present

## 2014-10-10 DIAGNOSIS — E559 Vitamin D deficiency, unspecified: Secondary | ICD-10-CM | POA: Diagnosis not present

## 2014-10-10 DIAGNOSIS — F419 Anxiety disorder, unspecified: Secondary | ICD-10-CM | POA: Diagnosis not present

## 2014-10-10 DIAGNOSIS — I10 Essential (primary) hypertension: Secondary | ICD-10-CM | POA: Diagnosis not present

## 2014-10-10 DIAGNOSIS — Z79899 Other long term (current) drug therapy: Secondary | ICD-10-CM | POA: Diagnosis not present

## 2014-10-10 DIAGNOSIS — M810 Age-related osteoporosis without current pathological fracture: Secondary | ICD-10-CM | POA: Diagnosis not present

## 2014-11-28 DIAGNOSIS — M5412 Radiculopathy, cervical region: Secondary | ICD-10-CM | POA: Diagnosis not present

## 2014-11-28 DIAGNOSIS — M25519 Pain in unspecified shoulder: Secondary | ICD-10-CM | POA: Diagnosis not present

## 2014-11-28 DIAGNOSIS — M542 Cervicalgia: Secondary | ICD-10-CM | POA: Diagnosis not present

## 2015-01-17 DIAGNOSIS — R531 Weakness: Secondary | ICD-10-CM | POA: Diagnosis not present

## 2015-01-17 DIAGNOSIS — M25511 Pain in right shoulder: Secondary | ICD-10-CM | POA: Diagnosis not present

## 2015-01-17 DIAGNOSIS — M542 Cervicalgia: Secondary | ICD-10-CM | POA: Diagnosis not present

## 2015-01-17 DIAGNOSIS — M25512 Pain in left shoulder: Secondary | ICD-10-CM | POA: Diagnosis not present

## 2015-01-17 DIAGNOSIS — Z79899 Other long term (current) drug therapy: Secondary | ICD-10-CM | POA: Diagnosis not present

## 2015-01-17 DIAGNOSIS — M791 Myalgia: Secondary | ICD-10-CM | POA: Diagnosis not present

## 2015-01-17 DIAGNOSIS — R200 Anesthesia of skin: Secondary | ICD-10-CM | POA: Diagnosis not present

## 2015-04-19 DIAGNOSIS — M791 Myalgia: Secondary | ICD-10-CM | POA: Diagnosis not present

## 2015-04-19 DIAGNOSIS — M5000 Cervical disc disorder with myelopathy, unspecified cervical region: Secondary | ICD-10-CM | POA: Diagnosis not present

## 2015-04-19 DIAGNOSIS — I10 Essential (primary) hypertension: Secondary | ICD-10-CM | POA: Diagnosis not present

## 2015-04-19 DIAGNOSIS — F418 Other specified anxiety disorders: Secondary | ICD-10-CM | POA: Diagnosis not present

## 2015-04-19 DIAGNOSIS — Z79899 Other long term (current) drug therapy: Secondary | ICD-10-CM | POA: Diagnosis not present

## 2015-04-19 DIAGNOSIS — Z1389 Encounter for screening for other disorder: Secondary | ICD-10-CM | POA: Diagnosis not present

## 2015-04-19 DIAGNOSIS — E780 Pure hypercholesterolemia: Secondary | ICD-10-CM | POA: Diagnosis not present

## 2015-07-17 DIAGNOSIS — I10 Essential (primary) hypertension: Secondary | ICD-10-CM | POA: Diagnosis not present

## 2015-07-17 DIAGNOSIS — M5412 Radiculopathy, cervical region: Secondary | ICD-10-CM | POA: Diagnosis not present

## 2015-07-17 DIAGNOSIS — F329 Major depressive disorder, single episode, unspecified: Secondary | ICD-10-CM | POA: Diagnosis not present

## 2015-07-17 DIAGNOSIS — M25511 Pain in right shoulder: Secondary | ICD-10-CM | POA: Diagnosis not present

## 2015-07-17 DIAGNOSIS — M791 Myalgia: Secondary | ICD-10-CM | POA: Diagnosis not present

## 2015-07-17 DIAGNOSIS — K7689 Other specified diseases of liver: Secondary | ICD-10-CM | POA: Diagnosis not present

## 2015-07-17 DIAGNOSIS — R531 Weakness: Secondary | ICD-10-CM | POA: Diagnosis not present

## 2015-07-17 DIAGNOSIS — R200 Anesthesia of skin: Secondary | ICD-10-CM | POA: Diagnosis not present

## 2015-07-17 DIAGNOSIS — M542 Cervicalgia: Secondary | ICD-10-CM | POA: Diagnosis not present

## 2015-07-17 DIAGNOSIS — Z79899 Other long term (current) drug therapy: Secondary | ICD-10-CM | POA: Diagnosis not present

## 2015-07-17 DIAGNOSIS — F17210 Nicotine dependence, cigarettes, uncomplicated: Secondary | ICD-10-CM | POA: Diagnosis not present

## 2015-07-17 DIAGNOSIS — M1990 Unspecified osteoarthritis, unspecified site: Secondary | ICD-10-CM | POA: Diagnosis not present

## 2015-07-17 DIAGNOSIS — M25512 Pain in left shoulder: Secondary | ICD-10-CM | POA: Diagnosis not present

## 2015-07-17 DIAGNOSIS — R7989 Other specified abnormal findings of blood chemistry: Secondary | ICD-10-CM | POA: Diagnosis not present

## 2015-07-19 DIAGNOSIS — R000 Tachycardia, unspecified: Secondary | ICD-10-CM | POA: Diagnosis not present

## 2015-07-19 DIAGNOSIS — M5000 Cervical disc disorder with myelopathy, unspecified cervical region: Secondary | ICD-10-CM | POA: Diagnosis not present

## 2015-07-19 DIAGNOSIS — I10 Essential (primary) hypertension: Secondary | ICD-10-CM | POA: Diagnosis not present

## 2015-07-19 DIAGNOSIS — F418 Other specified anxiety disorders: Secondary | ICD-10-CM | POA: Diagnosis not present

## 2015-09-18 DIAGNOSIS — M5412 Radiculopathy, cervical region: Secondary | ICD-10-CM | POA: Diagnosis not present

## 2015-09-18 DIAGNOSIS — G8929 Other chronic pain: Secondary | ICD-10-CM | POA: Diagnosis not present

## 2015-09-18 DIAGNOSIS — F329 Major depressive disorder, single episode, unspecified: Secondary | ICD-10-CM | POA: Diagnosis not present

## 2015-09-18 DIAGNOSIS — K047 Periapical abscess without sinus: Secondary | ICD-10-CM | POA: Diagnosis not present

## 2015-09-18 DIAGNOSIS — M542 Cervicalgia: Secondary | ICD-10-CM | POA: Diagnosis not present

## 2015-09-18 DIAGNOSIS — R531 Weakness: Secondary | ICD-10-CM | POA: Diagnosis not present

## 2015-09-18 DIAGNOSIS — M25511 Pain in right shoulder: Secondary | ICD-10-CM | POA: Diagnosis not present

## 2015-09-18 DIAGNOSIS — M791 Myalgia: Secondary | ICD-10-CM | POA: Diagnosis not present

## 2015-09-18 DIAGNOSIS — R200 Anesthesia of skin: Secondary | ICD-10-CM | POA: Diagnosis not present

## 2015-09-18 DIAGNOSIS — M25512 Pain in left shoulder: Secondary | ICD-10-CM | POA: Diagnosis not present

## 2015-09-18 DIAGNOSIS — Z79899 Other long term (current) drug therapy: Secondary | ICD-10-CM | POA: Diagnosis not present

## 2015-09-27 DIAGNOSIS — Z23 Encounter for immunization: Secondary | ICD-10-CM | POA: Diagnosis not present

## 2015-10-19 DIAGNOSIS — F418 Other specified anxiety disorders: Secondary | ICD-10-CM | POA: Diagnosis not present

## 2015-10-19 DIAGNOSIS — M791 Myalgia: Secondary | ICD-10-CM | POA: Diagnosis not present

## 2015-10-19 DIAGNOSIS — M5000 Cervical disc disorder with myelopathy, unspecified cervical region: Secondary | ICD-10-CM | POA: Diagnosis not present

## 2015-10-19 DIAGNOSIS — Z79899 Other long term (current) drug therapy: Secondary | ICD-10-CM | POA: Diagnosis not present

## 2015-11-09 DIAGNOSIS — F329 Major depressive disorder, single episode, unspecified: Secondary | ICD-10-CM | POA: Diagnosis not present

## 2015-11-09 DIAGNOSIS — M5412 Radiculopathy, cervical region: Secondary | ICD-10-CM | POA: Diagnosis not present

## 2015-11-09 DIAGNOSIS — M542 Cervicalgia: Secondary | ICD-10-CM | POA: Diagnosis not present

## 2015-11-09 DIAGNOSIS — R200 Anesthesia of skin: Secondary | ICD-10-CM | POA: Diagnosis not present

## 2015-11-09 DIAGNOSIS — K047 Periapical abscess without sinus: Secondary | ICD-10-CM | POA: Diagnosis not present

## 2015-11-09 DIAGNOSIS — M25511 Pain in right shoulder: Secondary | ICD-10-CM | POA: Diagnosis not present

## 2015-11-09 DIAGNOSIS — Z79899 Other long term (current) drug therapy: Secondary | ICD-10-CM | POA: Diagnosis not present

## 2015-11-09 DIAGNOSIS — R531 Weakness: Secondary | ICD-10-CM | POA: Diagnosis not present

## 2015-11-09 DIAGNOSIS — M25512 Pain in left shoulder: Secondary | ICD-10-CM | POA: Diagnosis not present

## 2015-11-09 DIAGNOSIS — G8929 Other chronic pain: Secondary | ICD-10-CM | POA: Diagnosis not present

## 2016-01-01 DIAGNOSIS — F329 Major depressive disorder, single episode, unspecified: Secondary | ICD-10-CM | POA: Diagnosis not present

## 2016-01-01 DIAGNOSIS — M542 Cervicalgia: Secondary | ICD-10-CM | POA: Diagnosis not present

## 2016-01-01 DIAGNOSIS — Z79891 Long term (current) use of opiate analgesic: Secondary | ICD-10-CM | POA: Diagnosis not present

## 2016-01-01 DIAGNOSIS — R200 Anesthesia of skin: Secondary | ICD-10-CM | POA: Diagnosis not present

## 2016-01-01 DIAGNOSIS — M791 Myalgia: Secondary | ICD-10-CM | POA: Diagnosis not present

## 2016-01-01 DIAGNOSIS — R531 Weakness: Secondary | ICD-10-CM | POA: Diagnosis not present

## 2016-01-01 DIAGNOSIS — G8929 Other chronic pain: Secondary | ICD-10-CM | POA: Diagnosis not present

## 2016-01-01 DIAGNOSIS — M25511 Pain in right shoulder: Secondary | ICD-10-CM | POA: Diagnosis not present

## 2016-01-01 DIAGNOSIS — M25512 Pain in left shoulder: Secondary | ICD-10-CM | POA: Diagnosis not present

## 2016-01-01 DIAGNOSIS — F17210 Nicotine dependence, cigarettes, uncomplicated: Secondary | ICD-10-CM | POA: Diagnosis not present

## 2016-01-31 DIAGNOSIS — Z720 Tobacco use: Secondary | ICD-10-CM | POA: Diagnosis not present

## 2016-01-31 DIAGNOSIS — F17200 Nicotine dependence, unspecified, uncomplicated: Secondary | ICD-10-CM | POA: Diagnosis not present

## 2016-01-31 DIAGNOSIS — E7800 Pure hypercholesterolemia, unspecified: Secondary | ICD-10-CM | POA: Diagnosis not present

## 2016-01-31 DIAGNOSIS — F418 Other specified anxiety disorders: Secondary | ICD-10-CM | POA: Diagnosis not present

## 2016-01-31 DIAGNOSIS — Z1231 Encounter for screening mammogram for malignant neoplasm of breast: Secondary | ICD-10-CM | POA: Diagnosis not present

## 2016-01-31 DIAGNOSIS — M810 Age-related osteoporosis without current pathological fracture: Secondary | ICD-10-CM | POA: Diagnosis not present

## 2016-01-31 DIAGNOSIS — I10 Essential (primary) hypertension: Secondary | ICD-10-CM | POA: Diagnosis not present

## 2016-01-31 DIAGNOSIS — M5000 Cervical disc disorder with myelopathy, unspecified cervical region: Secondary | ICD-10-CM | POA: Diagnosis not present

## 2016-02-01 ENCOUNTER — Other Ambulatory Visit: Payer: Self-pay | Admitting: Physician Assistant

## 2016-02-01 DIAGNOSIS — M810 Age-related osteoporosis without current pathological fracture: Secondary | ICD-10-CM

## 2016-02-01 DIAGNOSIS — Z1231 Encounter for screening mammogram for malignant neoplasm of breast: Secondary | ICD-10-CM

## 2016-02-22 ENCOUNTER — Ambulatory Visit: Payer: Medicare Other

## 2016-02-22 ENCOUNTER — Inpatient Hospital Stay: Admission: RE | Admit: 2016-02-22 | Payer: Medicare Other | Source: Ambulatory Visit

## 2016-02-26 DIAGNOSIS — M542 Cervicalgia: Secondary | ICD-10-CM | POA: Diagnosis not present

## 2016-02-26 DIAGNOSIS — M791 Myalgia: Secondary | ICD-10-CM | POA: Diagnosis not present

## 2016-02-26 DIAGNOSIS — M25512 Pain in left shoulder: Secondary | ICD-10-CM | POA: Diagnosis not present

## 2016-02-26 DIAGNOSIS — G8929 Other chronic pain: Secondary | ICD-10-CM | POA: Diagnosis not present

## 2016-02-26 DIAGNOSIS — M25511 Pain in right shoulder: Secondary | ICD-10-CM | POA: Diagnosis not present

## 2016-02-26 DIAGNOSIS — Z79899 Other long term (current) drug therapy: Secondary | ICD-10-CM | POA: Diagnosis not present

## 2016-02-26 DIAGNOSIS — R531 Weakness: Secondary | ICD-10-CM | POA: Diagnosis not present

## 2016-02-26 DIAGNOSIS — M5412 Radiculopathy, cervical region: Secondary | ICD-10-CM | POA: Diagnosis not present

## 2016-02-26 DIAGNOSIS — F5104 Psychophysiologic insomnia: Secondary | ICD-10-CM | POA: Diagnosis not present

## 2016-02-26 DIAGNOSIS — R200 Anesthesia of skin: Secondary | ICD-10-CM | POA: Diagnosis not present

## 2016-02-26 DIAGNOSIS — F17200 Nicotine dependence, unspecified, uncomplicated: Secondary | ICD-10-CM | POA: Diagnosis not present

## 2016-03-13 ENCOUNTER — Ambulatory Visit
Admission: RE | Admit: 2016-03-13 | Discharge: 2016-03-13 | Disposition: A | Discharge status: Home / Self Care | Payer: Medicare Other | Source: Ambulatory Visit | Attending: Physician Assistant | Admitting: Physician Assistant

## 2016-03-13 DIAGNOSIS — Z1231 Encounter for screening mammogram for malignant neoplasm of breast: Secondary | ICD-10-CM

## 2016-03-13 DIAGNOSIS — Z780 Asymptomatic menopausal state: Secondary | ICD-10-CM | POA: Diagnosis not present

## 2016-03-13 DIAGNOSIS — M810 Age-related osteoporosis without current pathological fracture: Secondary | ICD-10-CM | POA: Diagnosis not present

## 2016-05-13 DIAGNOSIS — M25511 Pain in right shoulder: Secondary | ICD-10-CM | POA: Diagnosis not present

## 2016-05-13 DIAGNOSIS — R200 Anesthesia of skin: Secondary | ICD-10-CM | POA: Diagnosis not present

## 2016-05-13 DIAGNOSIS — R531 Weakness: Secondary | ICD-10-CM | POA: Diagnosis not present

## 2016-05-13 DIAGNOSIS — F17210 Nicotine dependence, cigarettes, uncomplicated: Secondary | ICD-10-CM | POA: Diagnosis not present

## 2016-05-13 DIAGNOSIS — M5412 Radiculopathy, cervical region: Secondary | ICD-10-CM | POA: Diagnosis not present

## 2016-05-13 DIAGNOSIS — M542 Cervicalgia: Secondary | ICD-10-CM | POA: Diagnosis not present

## 2016-05-13 DIAGNOSIS — Z79899 Other long term (current) drug therapy: Secondary | ICD-10-CM | POA: Diagnosis not present

## 2016-05-13 DIAGNOSIS — M25512 Pain in left shoulder: Secondary | ICD-10-CM | POA: Diagnosis not present

## 2016-05-23 DIAGNOSIS — Z79899 Other long term (current) drug therapy: Secondary | ICD-10-CM | POA: Diagnosis not present

## 2016-05-23 DIAGNOSIS — E7800 Pure hypercholesterolemia, unspecified: Secondary | ICD-10-CM | POA: Diagnosis not present

## 2016-05-23 DIAGNOSIS — R748 Abnormal levels of other serum enzymes: Secondary | ICD-10-CM | POA: Diagnosis not present

## 2016-05-23 DIAGNOSIS — Z1389 Encounter for screening for other disorder: Secondary | ICD-10-CM | POA: Diagnosis not present

## 2016-05-23 DIAGNOSIS — M810 Age-related osteoporosis without current pathological fracture: Secondary | ICD-10-CM | POA: Diagnosis not present

## 2016-05-23 DIAGNOSIS — I10 Essential (primary) hypertension: Secondary | ICD-10-CM | POA: Diagnosis not present

## 2016-05-23 DIAGNOSIS — F418 Other specified anxiety disorders: Secondary | ICD-10-CM | POA: Diagnosis not present

## 2016-05-23 DIAGNOSIS — M5000 Cervical disc disorder with myelopathy, unspecified cervical region: Secondary | ICD-10-CM | POA: Diagnosis not present

## 2016-06-11 DIAGNOSIS — R748 Abnormal levels of other serum enzymes: Secondary | ICD-10-CM | POA: Diagnosis not present

## 2016-08-19 DIAGNOSIS — Z6822 Body mass index (BMI) 22.0-22.9, adult: Secondary | ICD-10-CM | POA: Diagnosis not present

## 2016-08-19 DIAGNOSIS — M5000 Cervical disc disorder with myelopathy, unspecified cervical region: Secondary | ICD-10-CM | POA: Diagnosis not present

## 2016-08-19 DIAGNOSIS — I10 Essential (primary) hypertension: Secondary | ICD-10-CM | POA: Diagnosis not present

## 2016-08-19 DIAGNOSIS — R748 Abnormal levels of other serum enzymes: Secondary | ICD-10-CM | POA: Diagnosis not present

## 2016-08-19 DIAGNOSIS — F418 Other specified anxiety disorders: Secondary | ICD-10-CM | POA: Diagnosis not present

## 2016-08-19 DIAGNOSIS — N390 Urinary tract infection, site not specified: Secondary | ICD-10-CM | POA: Diagnosis not present

## 2016-12-11 DIAGNOSIS — F418 Other specified anxiety disorders: Secondary | ICD-10-CM | POA: Diagnosis not present

## 2016-12-11 DIAGNOSIS — M5000 Cervical disc disorder with myelopathy, unspecified cervical region: Secondary | ICD-10-CM | POA: Diagnosis not present

## 2016-12-11 DIAGNOSIS — Z6822 Body mass index (BMI) 22.0-22.9, adult: Secondary | ICD-10-CM | POA: Diagnosis not present

## 2016-12-11 DIAGNOSIS — M791 Myalgia: Secondary | ICD-10-CM | POA: Diagnosis not present

## 2016-12-11 DIAGNOSIS — Z79899 Other long term (current) drug therapy: Secondary | ICD-10-CM | POA: Diagnosis not present

## 2017-04-30 DIAGNOSIS — M5000 Cervical disc disorder with myelopathy, unspecified cervical region: Secondary | ICD-10-CM | POA: Diagnosis not present

## 2017-04-30 DIAGNOSIS — Z720 Tobacco use: Secondary | ICD-10-CM | POA: Diagnosis not present

## 2017-04-30 DIAGNOSIS — F418 Other specified anxiety disorders: Secondary | ICD-10-CM | POA: Diagnosis not present

## 2017-04-30 DIAGNOSIS — I10 Essential (primary) hypertension: Secondary | ICD-10-CM | POA: Diagnosis not present

## 2017-07-31 DIAGNOSIS — Z79899 Other long term (current) drug therapy: Secondary | ICD-10-CM | POA: Diagnosis not present

## 2017-07-31 DIAGNOSIS — Z9181 History of falling: Secondary | ICD-10-CM | POA: Diagnosis not present

## 2017-07-31 DIAGNOSIS — E7800 Pure hypercholesterolemia, unspecified: Secondary | ICD-10-CM | POA: Diagnosis not present

## 2017-07-31 DIAGNOSIS — Z1389 Encounter for screening for other disorder: Secondary | ICD-10-CM | POA: Diagnosis not present

## 2017-07-31 DIAGNOSIS — F418 Other specified anxiety disorders: Secondary | ICD-10-CM | POA: Diagnosis not present

## 2017-07-31 DIAGNOSIS — M5000 Cervical disc disorder with myelopathy, unspecified cervical region: Secondary | ICD-10-CM | POA: Diagnosis not present

## 2017-07-31 DIAGNOSIS — I10 Essential (primary) hypertension: Secondary | ICD-10-CM | POA: Diagnosis not present

## 2017-12-22 DIAGNOSIS — Z79899 Other long term (current) drug therapy: Secondary | ICD-10-CM | POA: Diagnosis not present

## 2017-12-22 DIAGNOSIS — M791 Myalgia, unspecified site: Secondary | ICD-10-CM | POA: Diagnosis not present

## 2017-12-22 DIAGNOSIS — F418 Other specified anxiety disorders: Secondary | ICD-10-CM | POA: Diagnosis not present

## 2017-12-22 DIAGNOSIS — Z6822 Body mass index (BMI) 22.0-22.9, adult: Secondary | ICD-10-CM | POA: Diagnosis not present

## 2017-12-22 DIAGNOSIS — I10 Essential (primary) hypertension: Secondary | ICD-10-CM | POA: Diagnosis not present

## 2017-12-22 DIAGNOSIS — M5000 Cervical disc disorder with myelopathy, unspecified cervical region: Secondary | ICD-10-CM | POA: Diagnosis not present

## 2018-04-08 DIAGNOSIS — I10 Essential (primary) hypertension: Secondary | ICD-10-CM | POA: Diagnosis not present

## 2018-04-08 DIAGNOSIS — Z9181 History of falling: Secondary | ICD-10-CM | POA: Diagnosis not present

## 2018-04-08 DIAGNOSIS — Z0000 Encounter for general adult medical examination without abnormal findings: Secondary | ICD-10-CM | POA: Diagnosis not present

## 2018-04-08 DIAGNOSIS — M5000 Cervical disc disorder with myelopathy, unspecified cervical region: Secondary | ICD-10-CM | POA: Diagnosis not present

## 2018-04-08 DIAGNOSIS — E785 Hyperlipidemia, unspecified: Secondary | ICD-10-CM | POA: Diagnosis not present

## 2018-04-08 DIAGNOSIS — Z139 Encounter for screening, unspecified: Secondary | ICD-10-CM | POA: Diagnosis not present

## 2018-04-08 DIAGNOSIS — Z136 Encounter for screening for cardiovascular disorders: Secondary | ICD-10-CM | POA: Diagnosis not present

## 2018-04-08 DIAGNOSIS — F418 Other specified anxiety disorders: Secondary | ICD-10-CM | POA: Diagnosis not present

## 2018-04-08 DIAGNOSIS — M791 Myalgia, unspecified site: Secondary | ICD-10-CM | POA: Diagnosis not present

## 2018-10-20 DIAGNOSIS — Z23 Encounter for immunization: Secondary | ICD-10-CM | POA: Diagnosis not present

## 2019-02-14 DIAGNOSIS — F418 Other specified anxiety disorders: Secondary | ICD-10-CM | POA: Diagnosis not present

## 2019-02-14 DIAGNOSIS — I10 Essential (primary) hypertension: Secondary | ICD-10-CM | POA: Diagnosis not present

## 2019-02-14 DIAGNOSIS — M5000 Cervical disc disorder with myelopathy, unspecified cervical region: Secondary | ICD-10-CM | POA: Diagnosis not present

## 2019-02-14 DIAGNOSIS — J069 Acute upper respiratory infection, unspecified: Secondary | ICD-10-CM | POA: Diagnosis not present

## 2019-03-17 DIAGNOSIS — I10 Essential (primary) hypertension: Secondary | ICD-10-CM | POA: Diagnosis not present

## 2019-03-17 DIAGNOSIS — M5000 Cervical disc disorder with myelopathy, unspecified cervical region: Secondary | ICD-10-CM | POA: Diagnosis not present

## 2019-03-17 DIAGNOSIS — F418 Other specified anxiety disorders: Secondary | ICD-10-CM | POA: Diagnosis not present

## 2019-04-11 DIAGNOSIS — S51011A Laceration without foreign body of right elbow, initial encounter: Secondary | ICD-10-CM | POA: Diagnosis not present

## 2019-04-11 DIAGNOSIS — S70319A Abrasion, unspecified thigh, initial encounter: Secondary | ICD-10-CM | POA: Diagnosis not present

## 2019-04-11 DIAGNOSIS — Z6820 Body mass index (BMI) 20.0-20.9, adult: Secondary | ICD-10-CM | POA: Diagnosis not present

## 2019-04-21 DIAGNOSIS — N959 Unspecified menopausal and perimenopausal disorder: Secondary | ICD-10-CM | POA: Diagnosis not present

## 2019-04-21 DIAGNOSIS — E785 Hyperlipidemia, unspecified: Secondary | ICD-10-CM | POA: Diagnosis not present

## 2019-04-21 DIAGNOSIS — Z1331 Encounter for screening for depression: Secondary | ICD-10-CM | POA: Diagnosis not present

## 2019-04-21 DIAGNOSIS — Z9181 History of falling: Secondary | ICD-10-CM | POA: Diagnosis not present

## 2019-04-21 DIAGNOSIS — Z0000 Encounter for general adult medical examination without abnormal findings: Secondary | ICD-10-CM | POA: Diagnosis not present

## 2019-04-21 DIAGNOSIS — Z1231 Encounter for screening mammogram for malignant neoplasm of breast: Secondary | ICD-10-CM | POA: Diagnosis not present

## 2019-12-30 ENCOUNTER — Telehealth: Payer: Self-pay | Admitting: Acute Care

## 2019-12-30 DIAGNOSIS — F17210 Nicotine dependence, cigarettes, uncomplicated: Secondary | ICD-10-CM

## 2019-12-30 DIAGNOSIS — Z87891 Personal history of nicotine dependence: Secondary | ICD-10-CM

## 2020-01-03 NOTE — Telephone Encounter (Signed)
Spoke with pt and scheduled SDMV 01/25/20 1:30 CT ordered Nothing further needed

## 2020-01-25 ENCOUNTER — Ambulatory Visit
Admission: RE | Admit: 2020-01-25 | Discharge: 2020-01-25 | Disposition: A | Discharge status: Home / Self Care | Payer: Medicare Other | Source: Ambulatory Visit | Attending: Acute Care | Admitting: Acute Care

## 2020-01-25 ENCOUNTER — Other Ambulatory Visit: Payer: Self-pay

## 2020-01-25 ENCOUNTER — Ambulatory Visit (INDEPENDENT_AMBULATORY_CARE_PROVIDER_SITE_OTHER): Payer: Medicare Other | Admitting: Acute Care

## 2020-01-25 ENCOUNTER — Encounter: Payer: Self-pay | Admitting: Acute Care

## 2020-01-25 VITALS — BP 116/62 | HR 92 | Temp 97.6°F | Ht 62.0 in | Wt 113.0 lb

## 2020-01-25 DIAGNOSIS — F17210 Nicotine dependence, cigarettes, uncomplicated: Secondary | ICD-10-CM | POA: Diagnosis not present

## 2020-01-25 DIAGNOSIS — Z87891 Personal history of nicotine dependence: Secondary | ICD-10-CM

## 2020-01-25 NOTE — Progress Notes (Signed)
Shared Decision Making Visit Lung Cancer Screening Program [phone])   Eligibility:  Age 69 y.o.  Pack Years Smoking History Calculation 48 pack year smoking history (# packs/per year x # years smoked)  Recent History of coughing up blood  no  Unexplained weight loss? no ( >Than 15 pounds within the last 6 months )  Prior History Lung / other cancer no (Diagnosis within the last 5 years already requiring surveillance chest CT Scans).  Smoking Status Current Smoker  Former Smokers: Years since quit: NA  Quit Date: NA  Visit Components:  Discussion included one or more decision making aids. yes  Discussion included risk/benefits of screening. yes  Discussion included potential follow up diagnostic testing for abnormal scans. yes  Discussion included meaning and risk of over diagnosis. yes  Discussion included meaning and risk of False Positives. yes  Discussion included meaning of total radiation exposure. yes  Counseling Included:  Importance of adherence to annual lung cancer LDCT screening. yes  Impact of comorbidities on ability to participate in the program. yes  Ability and willingness to under diagnostic treatment. yes  Smoking Cessation Counseling:  Current Smokers:   Discussed importance of smoking cessation. yes  Information about tobacco cessation classes and interventions provided to patient. yes  Patient provided with "ticket" for LDCT Scan. yes  Symptomatic Patient. no  Counseling  Diagnosis Code: Tobacco Use Z72.0  Asymptomatic Patient yes  Counseling (Intermediate counseling: > three minutes counseling) [id number]  Former Smokers:   Discussed the importance of maintaining cigarette abstinence. yes  Diagnosis Code: Personal History of Nicotine Dependence. [id number]  Information about tobacco cessation classes and interventions provided to patient. Yes  Patient provided with "ticket" for LDCT Scan. yes  Written Order for Lung Cancer  Screening with LDCT placed in Epic. Yes (CT Chest Lung Cancer Screening Low Dose W/O CM) [id number] Z12.2-Screening of respiratory organs Z87.891-Personal history of nicotine dependence  BP 116/62 (BP Location: Right Arm, Cuff Size: Normal)   Pulse 92   Temp 97.6 F (36.4 C) (Oral)   Ht 5\' 2"  (1.575 m)   Wt 113 lb (51.3 kg)   SpO2 95%   BMI 20.67 kg/m    I have spent 25 minutes of face to face time with Ms. Bollier discussing the risks and benefits of lung cancer screening. We viewed a power point together that explained in detail the above noted topics. We paused at intervals to allow for questions to be asked and answered to ensure understanding.We discussed that the single most powerful action that she can take to decrease her risk of developing lung cancer is to quit smoking. We discussed whether or not she is ready to commit to setting a quit date. We discussed options for tools to aid in quitting smoking including nicotine replacement therapy, non-nicotine medications, support groups, Quit Smart classes, and behavior modification. We discussed that often times setting smaller, more achievable goals, such as eliminating 1 cigarette a day for a week and then 2 cigarettes a day for a week can be helpful in slowly decreasing the number of cigarettes smoked. This allows for a sense of accomplishment as well as providing a clinical benefit. I gave her the " Be Stronger Than Your Excuses" card with contact information for community resources, classes, free nicotine replacement therapy, and access to mobile apps, text messaging, and on-line smoking cessation help. I have also given her my card and contact information in the event she needs to contact me. We discussed the time and  location of the scan, and that either Doroteo Glassman RN or I will call with the results within 24-48 hours of receiving them. I have offered her  a copy of the power point we viewed  as a resource in the event they need  reinforcement of the concepts we discussed today in the office. The patient verbalized understanding of all of  the above and had no further questions upon leaving the office. They have my contact information in the event they have any further questions.  I spent 4 minutes counseling on smoking cessation and the health risks of continued tobacco abuse.  I explained to the patient that there has been a high incidence of coronary artery disease noted on these exams. I explained that this is a non-gated exam therefore degree or severity cannot be determined. This patient is currently on statin therapy. I have asked the patient to follow-up with their PCP regarding any incidental finding of coronary artery disease and management with diet or medication as their PCP  feels is clinically indicated. The patient verbalized understanding of the above and had no further questions upon completion of the visit.      Magdalen Spatz, NP 01/25/2020

## 2020-01-25 NOTE — Patient Instructions (Signed)
Thank you for participating in the [facility]. It was our pleasure to meet you today. We will call you with the results of your scan within the next few days. Your scan will be assigned a Lung RADS category score by the physicians reading the scans.  This Lung RADS score determines follow up scanning.  See below for description of categories, and follow up screening recommendations. We will be in touch to schedule your follow up screening annually or based on recommendations of our providers. We will fax a copy of your scan results to your Primary Care Physician, or the physician who referred you to the program, to ensure they have the results. Please call the office if you have any questions or concerns regarding your scanning experience or results.  Our office number is [phone]. Please speak with Denise Phelps, RN. She is our Lung Cancer Screening RN. If she is unavailable when you call, please have the office staff send her a message. She will return your call at her earliest convenience. Remember, if your scan is normal, we will scan you annually as long as you continue to meet the criteria for the program. (Age 55-77, Current smoker or smoker who has quit within the last 15 years). If you are a smoker, remember, quitting is the single most powerful action that you can take to decrease your risk of lung cancer and other pulmonary, breathing related problems. We know quitting is hard, and we are here to help.  Please let us know if there is anything we can do to help you meet your goal of quitting. If you are a former smoker, congratulations. We are proud of you! Remain smoke free! Remember you can refer friends or family members through the number above.  We will screen them to make sure they meet criteria for the program. Thank you for helping us take better care of you by participating in Lung Screening.  Lung RADS Categories:  Lung RADS 1: no nodules  or definitely non-concerning nodules.  Recommendation is for a repeat annual scan in 12 months.  Lung RADS 2:  nodules that are non-concerning in appearance and behavior with a very low likelihood of becoming an active cancer. Recommendation is for a repeat annual scan in 12 months.  Lung RADS 3: nodules that are probably non-concerning , includes nodules with a low likelihood of becoming an active cancer.  Recommendation is for a 6-month repeat screening scan. Often noted after an upper respiratory illness. We will be in touch to make sure you have no questions, and to schedule your 6-month scan.  Lung RADS 4 A: nodules with concerning findings, recommendation is most often for a follow up scan in 3 months or additional testing based on our provider's assessment of the scan. We will be in touch to make sure you have no questions and to schedule the recommended 3 month follow up scan.  Lung RADS 4 B:  indicates findings that are concerning. We will be in touch with you to schedule additional diagnostic testing based on our provider's  assessment of the scan.   

## 2020-01-26 ENCOUNTER — Other Ambulatory Visit: Payer: Self-pay | Admitting: *Deleted

## 2020-01-26 DIAGNOSIS — F17210 Nicotine dependence, cigarettes, uncomplicated: Secondary | ICD-10-CM

## 2020-01-26 DIAGNOSIS — Z87891 Personal history of nicotine dependence: Secondary | ICD-10-CM

## 2020-01-26 NOTE — Progress Notes (Signed)

## 2020-02-09 ENCOUNTER — Other Ambulatory Visit: Payer: Self-pay | Admitting: Physician Assistant

## 2020-02-09 DIAGNOSIS — Z1231 Encounter for screening mammogram for malignant neoplasm of breast: Secondary | ICD-10-CM

## 2020-02-09 DIAGNOSIS — I10 Essential (primary) hypertension: Secondary | ICD-10-CM | POA: Diagnosis not present

## 2020-02-09 DIAGNOSIS — F418 Other specified anxiety disorders: Secondary | ICD-10-CM | POA: Diagnosis not present

## 2020-02-09 DIAGNOSIS — E7800 Pure hypercholesterolemia, unspecified: Secondary | ICD-10-CM | POA: Diagnosis not present

## 2020-02-09 DIAGNOSIS — Z139 Encounter for screening, unspecified: Secondary | ICD-10-CM | POA: Diagnosis not present

## 2020-02-09 DIAGNOSIS — Z6820 Body mass index (BMI) 20.0-20.9, adult: Secondary | ICD-10-CM | POA: Diagnosis not present

## 2020-03-05 ENCOUNTER — Ambulatory Visit: Payer: Federal, State, Local not specified - PPO

## 2020-03-11 IMAGING — CT CT CHEST LUNG CANCER SCREENING LOW DOSE W/O CM
1 of 3 series · 10 of 30 positions shown, 13 images · non-contrast
Comparison: None.

CLINICAL DATA: 68-year-old asymptomatic female current smoker with
48 pack-year smoking history.

EXAM:
CT CHEST WITHOUT CONTRAST LOW-DOSE FOR LUNG CANCER SCREENING
TECHNIQUE: Multidetector CT imaging of the chest was performed following the
standard protocol without IV contrast.

[ct lung segmentation data · axial · 0.60mm/px · z∈[-314,-314]mm · 10 of 315 frames shown]
[frame 1/315  mediastinal]
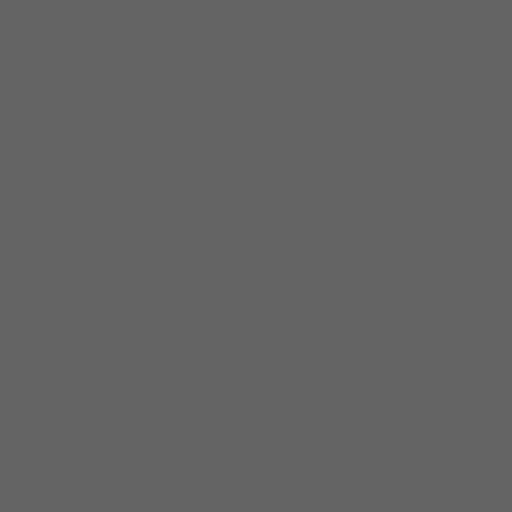
[frame 1/315  lung]
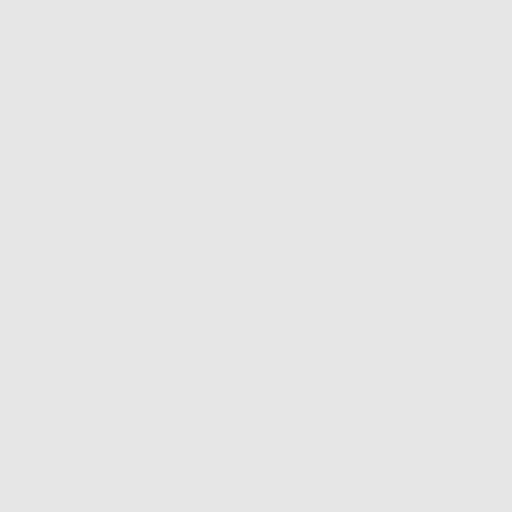
[frame 35/315  lung]
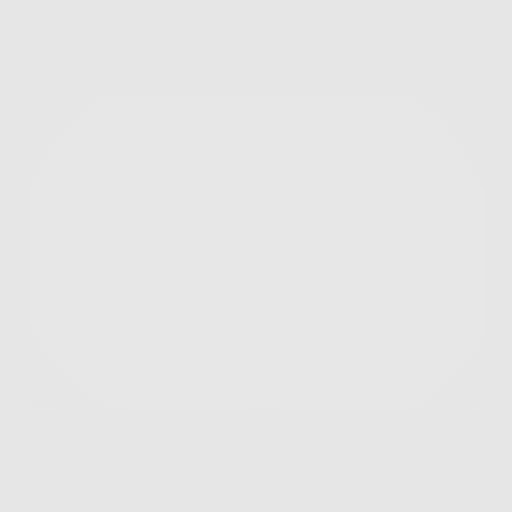
[frame 70/315  lung]
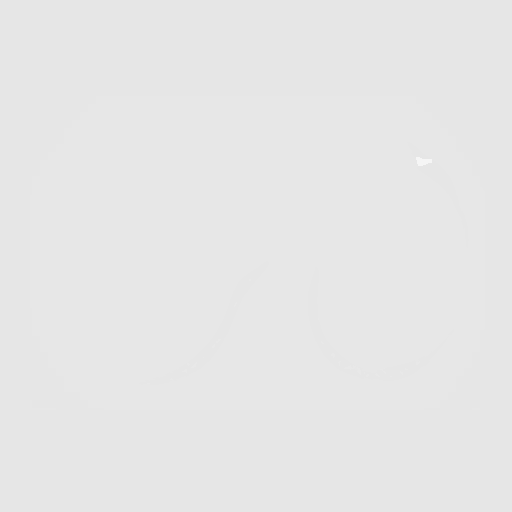
[frame 105/315  lung]
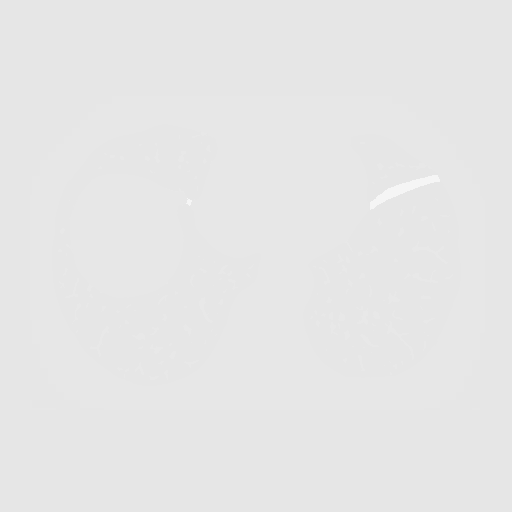
[frame 140/315  mediastinal]
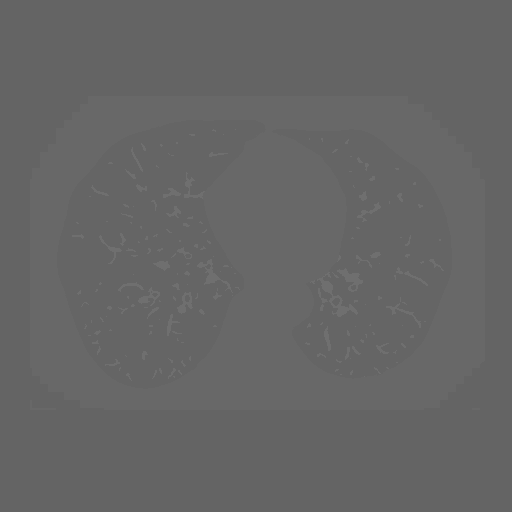
[frame 140/315  lung]
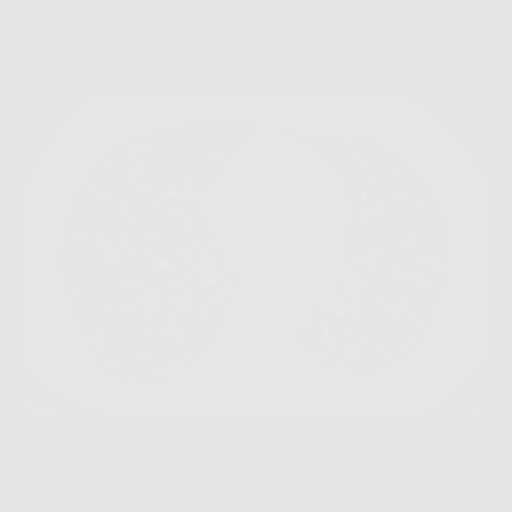
[frame 175/315  lung]
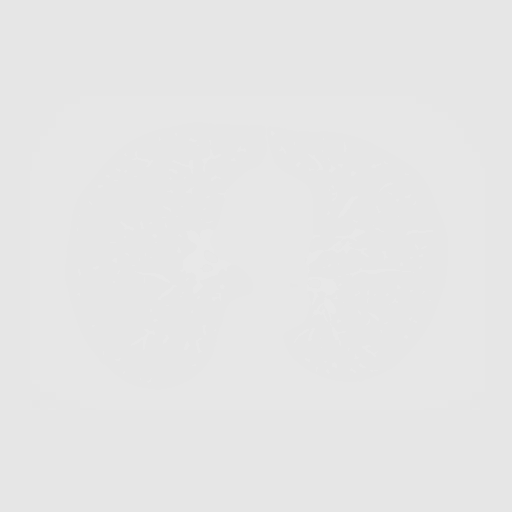
[frame 210/315  lung]
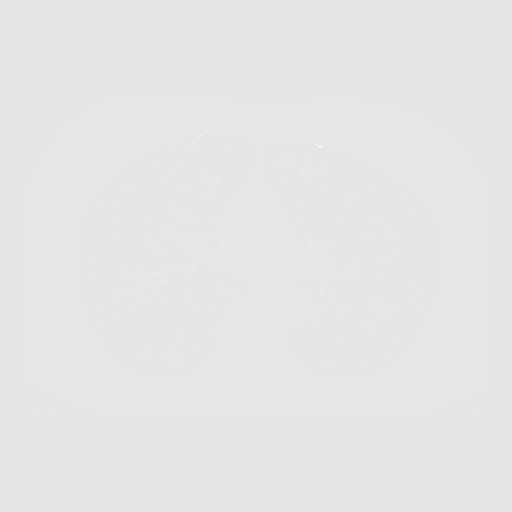
[frame 245/315  lung]
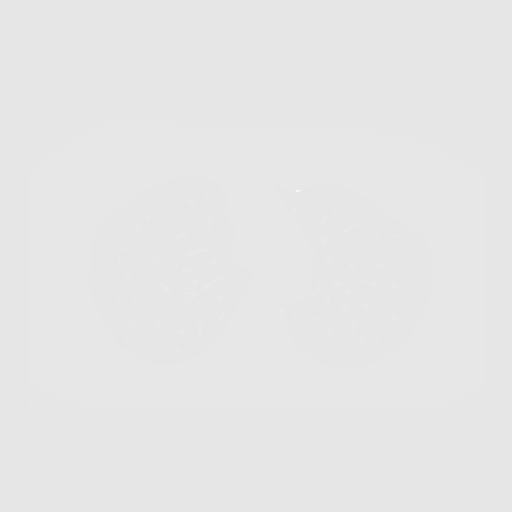
[frame 280/315  mediastinal]
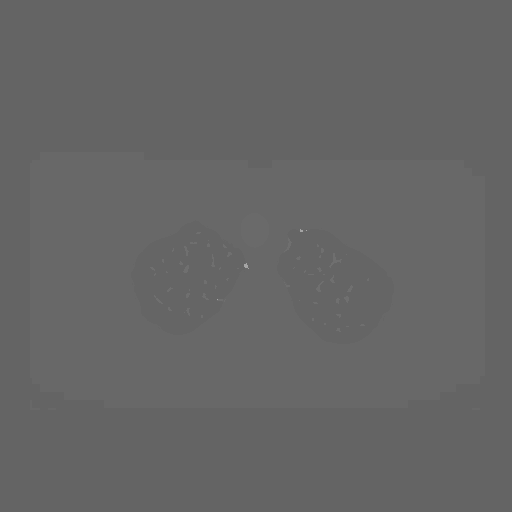
[frame 280/315  lung]
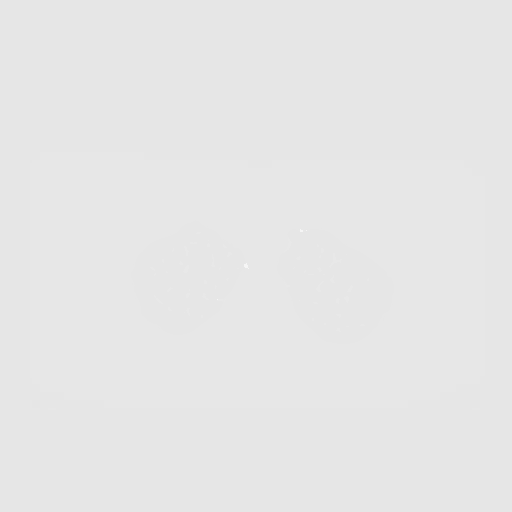
[frame 315/315  lung]
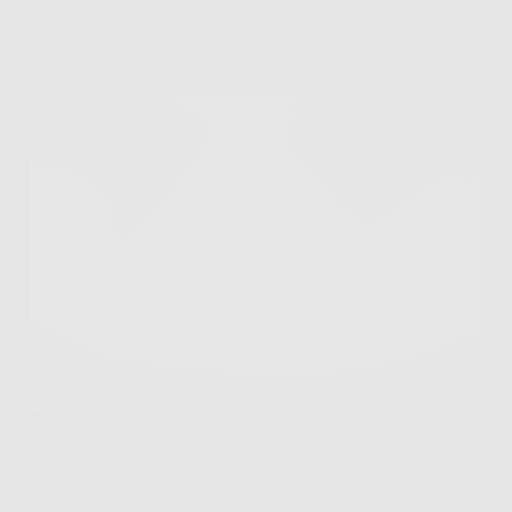

[10 of 30 positions shown; findings below may reference images not displayed]

FINDINGS: Cardiovascular: Normal heart size. No significant pericardial
effusion/thickening. Left anterior descending and right coronary
atherosclerosis. Atherosclerotic nonaneurysmal thoracic aorta.
Normal caliber pulmonary arteries.

Mediastinum/Nodes: Subcentimeter hypodense anterior left thyroid
nodule, requiring no follow-up. Unremarkable esophagus. No
pathologically enlarged axillary, mediastinal or hilar lymph nodes,
noting limited sensitivity for the detection of hilar adenopathy on
this noncontrast study.

Lungs/Pleura: No pneumothorax. No pleural effusion. Moderate
centrilobular emphysema. No acute consolidative airspace disease or
lung masses. Tiny solid right upper lobe pulmonary nodule measuring
2.0 mm in volume derived mean diameter (series 3/image 152). No
additional significant pulmonary nodules. Symmetric mild biapical
pleural-parenchymal scarring.

Upper abdomen: Cholecystectomy. Minimally complex 3.5 cm anterior
left liver cyst with thin internal septation. Additional
subcentimeter hypodense central liver lesion is too small to
characterize and requires no follow-up unless the patient has risk
factors for liver malignancy.

Musculoskeletal: No aggressive appearing focal osseous lesions. Mild
thoracic spondylosis.
IMPRESSION: 1. Lung-RADS 2, benign appearance or behavior. Continue annual
screening with low-dose chest CT without contrast in 12 months.
2. Two-vessel coronary atherosclerosis.
3. Aortic Atherosclerosis (6T0LY-266.6) and Emphysema (6T0LY-Z7T.P).

## 2020-03-13 ENCOUNTER — Ambulatory Visit: Payer: Federal, State, Local not specified - PPO

## 2020-04-23 DIAGNOSIS — E785 Hyperlipidemia, unspecified: Secondary | ICD-10-CM | POA: Diagnosis not present

## 2020-04-23 DIAGNOSIS — N959 Unspecified menopausal and perimenopausal disorder: Secondary | ICD-10-CM | POA: Diagnosis not present

## 2020-04-23 DIAGNOSIS — Z9181 History of falling: Secondary | ICD-10-CM | POA: Diagnosis not present

## 2020-04-23 DIAGNOSIS — Z0000 Encounter for general adult medical examination without abnormal findings: Secondary | ICD-10-CM | POA: Diagnosis not present

## 2020-04-23 DIAGNOSIS — Z1331 Encounter for screening for depression: Secondary | ICD-10-CM | POA: Diagnosis not present

## 2020-04-23 DIAGNOSIS — Z1231 Encounter for screening mammogram for malignant neoplasm of breast: Secondary | ICD-10-CM | POA: Diagnosis not present

## 2020-04-23 DIAGNOSIS — Z1211 Encounter for screening for malignant neoplasm of colon: Secondary | ICD-10-CM | POA: Diagnosis not present

## 2020-04-25 ENCOUNTER — Ambulatory Visit
Admission: RE | Admit: 2020-04-25 | Discharge: 2020-04-25 | Disposition: A | Discharge status: Home / Self Care | Payer: Medicare Other | Source: Ambulatory Visit | Attending: Physician Assistant | Admitting: Physician Assistant

## 2020-04-25 ENCOUNTER — Other Ambulatory Visit: Payer: Self-pay

## 2020-04-25 DIAGNOSIS — Z1231 Encounter for screening mammogram for malignant neoplasm of breast: Secondary | ICD-10-CM

## 2020-09-05 DIAGNOSIS — I2510 Atherosclerotic heart disease of native coronary artery without angina pectoris: Secondary | ICD-10-CM | POA: Diagnosis not present

## 2020-09-05 DIAGNOSIS — Z720 Tobacco use: Secondary | ICD-10-CM | POA: Diagnosis not present

## 2020-09-05 DIAGNOSIS — Z681 Body mass index (BMI) 19 or less, adult: Secondary | ICD-10-CM | POA: Diagnosis not present

## 2020-09-05 DIAGNOSIS — I10 Essential (primary) hypertension: Secondary | ICD-10-CM | POA: Diagnosis not present

## 2020-09-05 DIAGNOSIS — E7800 Pure hypercholesterolemia, unspecified: Secondary | ICD-10-CM | POA: Diagnosis not present

## 2020-09-05 DIAGNOSIS — F418 Other specified anxiety disorders: Secondary | ICD-10-CM | POA: Diagnosis not present

## 2020-09-05 DIAGNOSIS — Z23 Encounter for immunization: Secondary | ICD-10-CM | POA: Diagnosis not present

## 2020-09-05 DIAGNOSIS — M5000 Cervical disc disorder with myelopathy, unspecified cervical region: Secondary | ICD-10-CM | POA: Diagnosis not present

## 2021-02-18 ENCOUNTER — Other Ambulatory Visit: Payer: Self-pay | Admitting: *Deleted

## 2021-02-18 DIAGNOSIS — Z87891 Personal history of nicotine dependence: Secondary | ICD-10-CM

## 2021-02-18 DIAGNOSIS — F17210 Nicotine dependence, cigarettes, uncomplicated: Secondary | ICD-10-CM

## 2021-03-07 ENCOUNTER — Ambulatory Visit: Payer: Federal, State, Local not specified - PPO

## 2021-03-20 ENCOUNTER — Ambulatory Visit: Payer: Federal, State, Local not specified - PPO

## 2021-04-02 ENCOUNTER — Inpatient Hospital Stay: Admission: RE | Admit: 2021-04-02 | Payer: Federal, State, Local not specified - PPO | Source: Ambulatory Visit

## 2021-04-15 ENCOUNTER — Ambulatory Visit
Admission: RE | Admit: 2021-04-15 | Discharge: 2021-04-15 | Disposition: A | Discharge status: Home / Self Care | Payer: Medicare Other | Source: Ambulatory Visit | Attending: Acute Care | Admitting: Acute Care

## 2021-04-15 ENCOUNTER — Other Ambulatory Visit: Payer: Self-pay

## 2021-04-15 DIAGNOSIS — Z87891 Personal history of nicotine dependence: Secondary | ICD-10-CM

## 2021-04-15 DIAGNOSIS — F17210 Nicotine dependence, cigarettes, uncomplicated: Secondary | ICD-10-CM | POA: Diagnosis not present

## 2021-04-19 NOTE — Progress Notes (Signed)

## 2021-04-23 DIAGNOSIS — Z87891 Personal history of nicotine dependence: Secondary | ICD-10-CM | POA: Diagnosis not present

## 2021-04-23 DIAGNOSIS — I700 Atherosclerosis of aorta: Secondary | ICD-10-CM | POA: Diagnosis not present

## 2021-04-23 DIAGNOSIS — I10 Essential (primary) hypertension: Secondary | ICD-10-CM | POA: Diagnosis not present

## 2021-04-23 DIAGNOSIS — M5000 Cervical disc disorder with myelopathy, unspecified cervical region: Secondary | ICD-10-CM | POA: Diagnosis not present

## 2021-04-23 DIAGNOSIS — Z720 Tobacco use: Secondary | ICD-10-CM | POA: Diagnosis not present

## 2021-04-23 DIAGNOSIS — Z139 Encounter for screening, unspecified: Secondary | ICD-10-CM | POA: Diagnosis not present

## 2021-04-23 DIAGNOSIS — I2510 Atherosclerotic heart disease of native coronary artery without angina pectoris: Secondary | ICD-10-CM | POA: Diagnosis not present

## 2021-04-23 DIAGNOSIS — F418 Other specified anxiety disorders: Secondary | ICD-10-CM | POA: Diagnosis not present

## 2021-04-23 DIAGNOSIS — E7800 Pure hypercholesterolemia, unspecified: Secondary | ICD-10-CM | POA: Diagnosis not present

## 2021-04-23 DIAGNOSIS — Z681 Body mass index (BMI) 19 or less, adult: Secondary | ICD-10-CM | POA: Diagnosis not present

## 2021-04-25 DIAGNOSIS — Z0000 Encounter for general adult medical examination without abnormal findings: Secondary | ICD-10-CM | POA: Diagnosis not present

## 2021-04-25 DIAGNOSIS — Z9181 History of falling: Secondary | ICD-10-CM | POA: Diagnosis not present

## 2021-04-25 DIAGNOSIS — Z1331 Encounter for screening for depression: Secondary | ICD-10-CM | POA: Diagnosis not present

## 2021-04-25 DIAGNOSIS — Z139 Encounter for screening, unspecified: Secondary | ICD-10-CM | POA: Diagnosis not present

## 2021-04-25 DIAGNOSIS — E785 Hyperlipidemia, unspecified: Secondary | ICD-10-CM | POA: Diagnosis not present

## 2021-04-26 ENCOUNTER — Other Ambulatory Visit: Payer: Self-pay | Admitting: *Deleted

## 2021-04-26 DIAGNOSIS — Z87891 Personal history of nicotine dependence: Secondary | ICD-10-CM

## 2021-04-26 DIAGNOSIS — F17210 Nicotine dependence, cigarettes, uncomplicated: Secondary | ICD-10-CM

## 2021-05-29 ENCOUNTER — Other Ambulatory Visit: Payer: Self-pay | Admitting: Physician Assistant

## 2021-05-29 DIAGNOSIS — R5381 Other malaise: Secondary | ICD-10-CM

## 2021-05-31 IMAGING — CT CT CHEST LUNG CANCER SCREENING LOW DOSE W/O CM
1 series · 10 of 10 positions shown, 13 images · non-contrast
Comparison: 01/25/2020 screening chest CT.

CLINICAL DATA: 70-year-old asymptomatic female current smoker with
49 pack-year smoking history.

EXAM:
CT CHEST WITHOUT CONTRAST LOW-DOSE FOR LUNG CANCER SCREENING
TECHNIQUE: Multidetector CT imaging of the chest was performed following the
standard protocol without IV contrast.

[ct lung segmentation data · axial · 0.60mm/px · z∈[-326,-326]mm · 10 of 338 frames shown]
[frame 1/338  mediastinal]
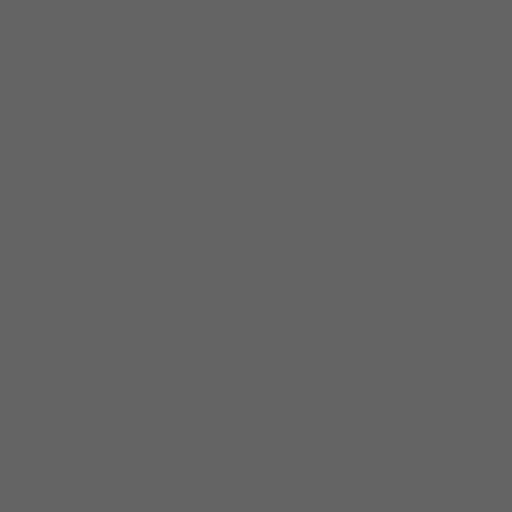
[frame 1/338  lung]
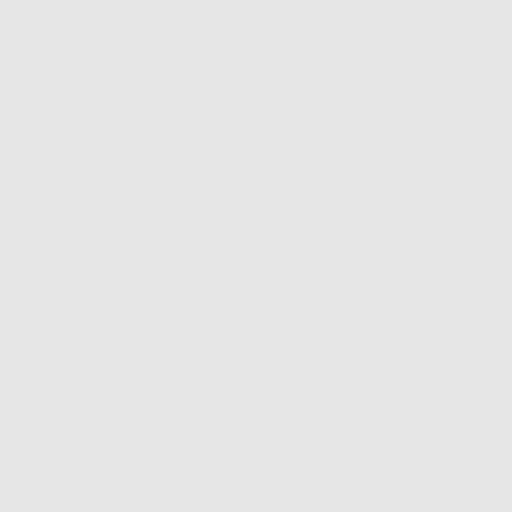
[frame 38/338  lung]
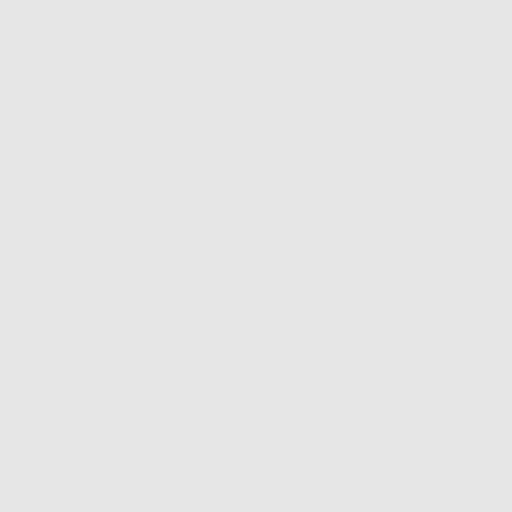
[frame 75/338  lung]
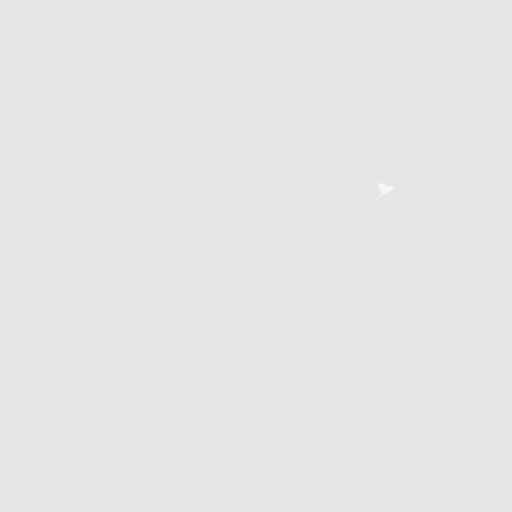
[frame 113/338  lung]
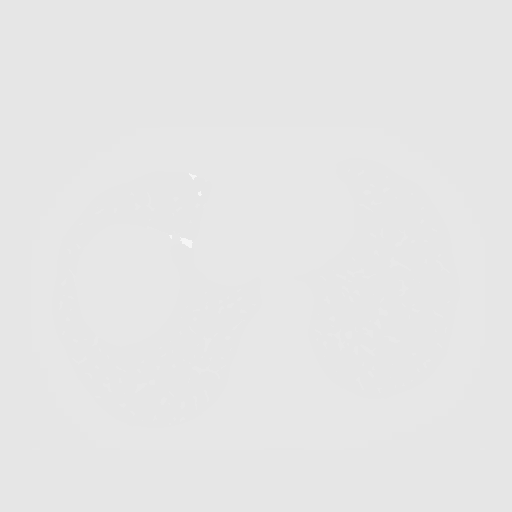
[frame 150/338  mediastinal]
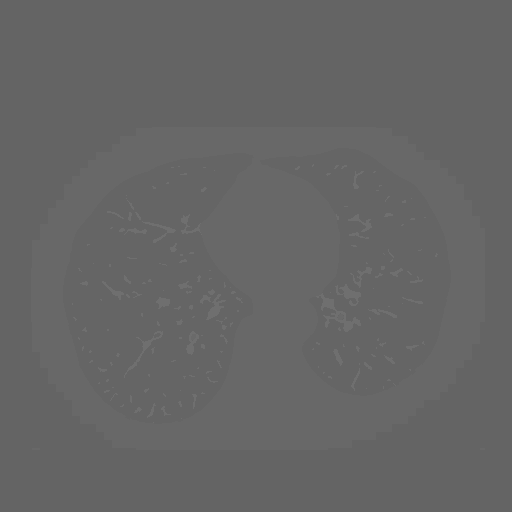
[frame 150/338  lung]
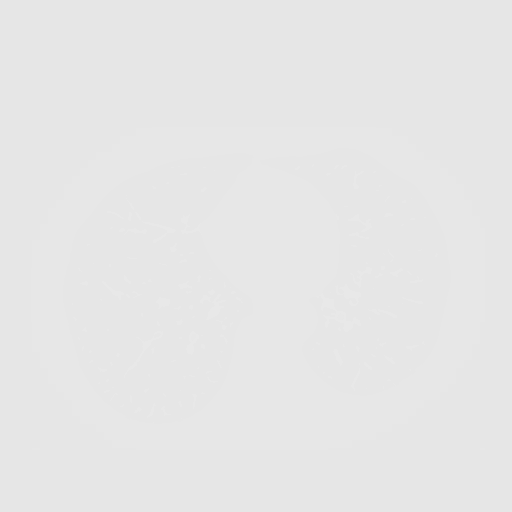
[frame 188/338  lung]
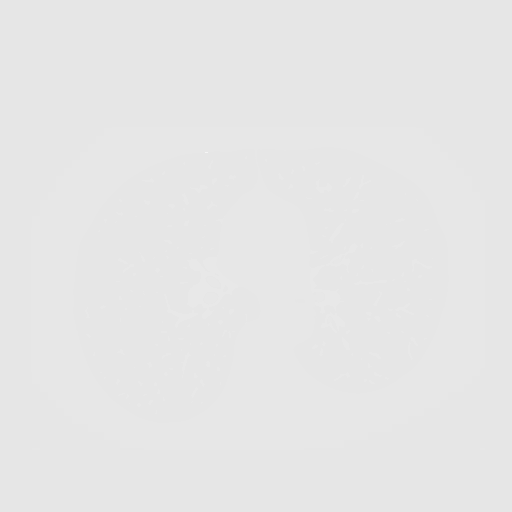
[frame 225/338  lung]
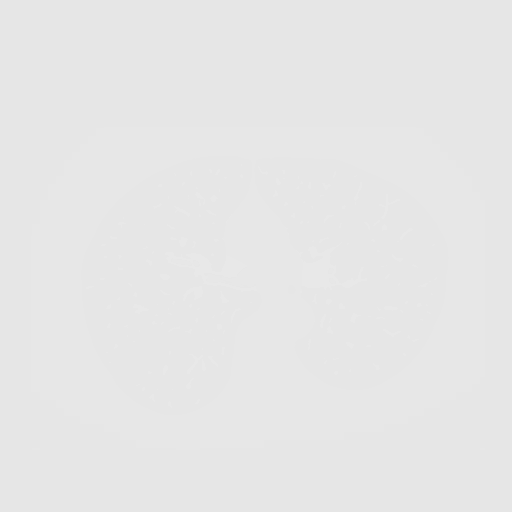
[frame 263/338  lung]
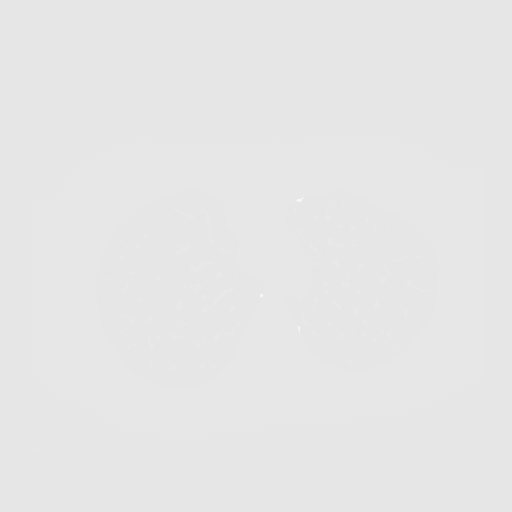
[frame 300/338  mediastinal]
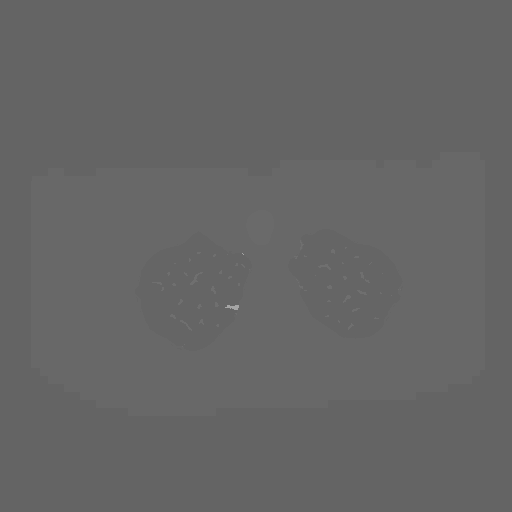
[frame 300/338  lung]
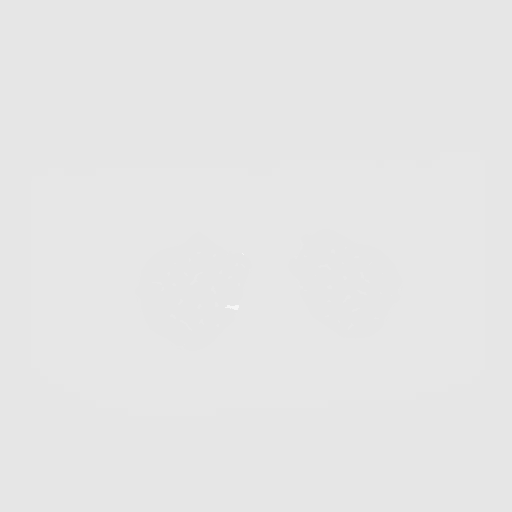
[frame 338/338  lung]
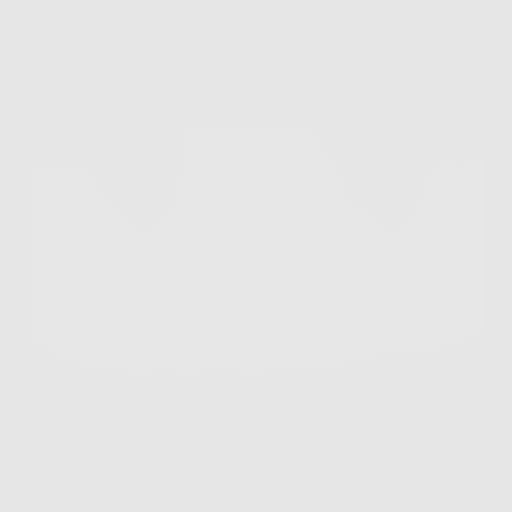

[10 of 10 positions shown; findings below may reference images not displayed]

FINDINGS: Cardiovascular: Normal heart size. No significant pericardial
effusion/thickening. Left anterior descending coronary
atherosclerosis. Atherosclerotic nonaneurysmal thoracic aorta.
Normal caliber pulmonary arteries.

Mediastinum/Nodes: No discrete thyroid nodules. Unremarkable
esophagus. No pathologically enlarged axillary, mediastinal or hilar
lymph nodes, noting limited sensitivity for the detection of hilar
adenopathy on this noncontrast study.

Lungs/Pleura: No pneumothorax. No pleural effusion. Moderate
centrilobular and paraseptal emphysema with diffuse bronchial wall
thickening. No acute consolidative airspace disease or lung masses.
No significant growth of previously visualized tiny solid right
upper lobe pulmonary nodule. No new significant pulmonary nodules.

Upper abdomen: Cholecystectomy. Scattered simple left liver cysts,
largest 3.6 cm.

Musculoskeletal: No aggressive appearing focal osseous lesions. Mild
thoracic spondylosis.
IMPRESSION: Lung-RADS 2, benign appearance or behavior. Continue annual
screening with low-dose chest CT without contrast in 12 months.

One vessel coronary atherosclerosis.

Aortic Atherosclerosis (DINR4-Y6S.S) and Emphysema (DINR4-S1X.R).

## 2021-06-05 ENCOUNTER — Other Ambulatory Visit: Payer: Self-pay | Admitting: Physician Assistant

## 2021-06-05 DIAGNOSIS — E2839 Other primary ovarian failure: Secondary | ICD-10-CM

## 2021-07-10 ENCOUNTER — Other Ambulatory Visit: Payer: Self-pay | Admitting: Physician Assistant

## 2021-07-10 DIAGNOSIS — Z1231 Encounter for screening mammogram for malignant neoplasm of breast: Secondary | ICD-10-CM

## 2021-11-07 DIAGNOSIS — Z23 Encounter for immunization: Secondary | ICD-10-CM | POA: Diagnosis not present

## 2021-12-30 ENCOUNTER — Ambulatory Visit
Admission: RE | Admit: 2021-12-30 | Discharge: 2021-12-30 | Disposition: A | Discharge status: Home / Self Care | Payer: Medicare Other | Source: Ambulatory Visit | Attending: Physician Assistant | Admitting: Physician Assistant

## 2021-12-30 DIAGNOSIS — Z1231 Encounter for screening mammogram for malignant neoplasm of breast: Secondary | ICD-10-CM | POA: Diagnosis not present

## 2021-12-30 DIAGNOSIS — Z780 Asymptomatic menopausal state: Secondary | ICD-10-CM | POA: Diagnosis not present

## 2021-12-30 DIAGNOSIS — M810 Age-related osteoporosis without current pathological fracture: Secondary | ICD-10-CM | POA: Diagnosis not present

## 2021-12-30 DIAGNOSIS — M85852 Other specified disorders of bone density and structure, left thigh: Secondary | ICD-10-CM | POA: Diagnosis not present

## 2021-12-30 DIAGNOSIS — E2839 Other primary ovarian failure: Secondary | ICD-10-CM

## 2022-01-10 DIAGNOSIS — F418 Other specified anxiety disorders: Secondary | ICD-10-CM | POA: Diagnosis not present

## 2022-01-10 DIAGNOSIS — I10 Essential (primary) hypertension: Secondary | ICD-10-CM | POA: Diagnosis not present

## 2022-01-10 DIAGNOSIS — M5000 Cervical disc disorder with myelopathy, unspecified cervical region: Secondary | ICD-10-CM | POA: Diagnosis not present

## 2022-01-10 DIAGNOSIS — I2510 Atherosclerotic heart disease of native coronary artery without angina pectoris: Secondary | ICD-10-CM | POA: Diagnosis not present

## 2022-01-10 DIAGNOSIS — Z6820 Body mass index (BMI) 20.0-20.9, adult: Secondary | ICD-10-CM | POA: Diagnosis not present

## 2022-01-10 DIAGNOSIS — Z720 Tobacco use: Secondary | ICD-10-CM | POA: Diagnosis not present

## 2022-01-10 DIAGNOSIS — E7800 Pure hypercholesterolemia, unspecified: Secondary | ICD-10-CM | POA: Diagnosis not present

## 2022-01-10 DIAGNOSIS — I700 Atherosclerosis of aorta: Secondary | ICD-10-CM | POA: Diagnosis not present

## 2022-01-21 DIAGNOSIS — M791 Myalgia, unspecified site: Secondary | ICD-10-CM | POA: Diagnosis not present

## 2022-01-22 ENCOUNTER — Telehealth: Payer: Self-pay

## 2022-01-22 NOTE — Telephone Encounter (Signed)
NOTES SCANNED TO REFERRAL 

## 2022-02-09 NOTE — Progress Notes (Signed)
Cardiology Office Note:    Date:  02/10/2022   ID:  Heather Stevens, DOB 06-May-1951, MRN [id number]  PCP:  Lonie Peak, PA-C   [facility] Providers Cardiologist:  Christell Constant, MD     Referring MD: Lonie Peak, PA-C   CC: Q waves on old EKG Consulted for the evaluation of CAC at the behest of Lonie Peak, [place]  History of Present Illness:    Heather Stevens is a 71 y.o. female with a hx of HTN, HLD, Current tobacco abuse, LAD and RCA CAC and aortic atherosclerosis.  Patient notes that she is feeling OK.  Notes that she has exertional tachycardia; when walking her dog 2 miles and goes up an incline.  Notes heart racing when she tries to catch her breath   All palpitations are exertional.  Also gets "the lighting bolts-"  sitting watching TV and has chest pain.  Burning chest pain with rest. No shortness of breath at rest.  No PND or orthopnea.  No weight gain, leg swelling , or abdominal swelling.  No syncope or near syncope . She has chronic neck and shoulder pain that sometimes leads to chest pressure.  Has had this chest pain for a while (when she had her prior heart catheterization.  Patient reports prior cardiac testing including 17 years ago had chest pain with negative heart catheterization.   Past Medical History:  Diagnosis Date   Anxiety    Arthritis    Cervicalgia    Chronic pain    Gallstones    HLD (hyperlipidemia)    HTN (hypertension)    IBS (irritable bowel syndrome)    Myalgia    Osteoporosis    Pneumonia     Past Surgical History:  Procedure Laterality Date   BLADDER SURGERY     CESAREAN SECTION     CHOLECYSTECTOMY     VAGINAL HYSTERECTOMY      Current Medications: Current Meds  Medication Sig   amitriptyline (ELAVIL) 25 MG tablet Take 25 mg by mouth at bedtime.   aspirin 81 MG tablet Take 81 mg by mouth daily.   atorvastatin (LIPITOR) 20 MG tablet Take 20 mg by mouth daily.   Calcium Carbonate-Vitamin D 600-400 MG-UNIT chew  tablet Chew 1 tablet by mouth 3 (three) times daily with meals.   citalopram (CELEXA) 20 MG tablet daily.   ezetimibe (ZETIA) 10 MG tablet Take 1 tablet (10 mg total) by mouth daily.   ibuprofen (ADVIL) 200 MG tablet Take 400 mg by mouth every 6 (six) hours as needed for headache, mild pain or moderate pain.   loperamide (IMODIUM A-D) 2 MG tablet Take 2 mg by mouth as needed for diarrhea or loose stools (IBS).   metoprolol succinate (TOPROL-XL) 50 MG 24 hr tablet Take 12.5 mg by mouth daily. Take with or immediately following a meal.   tiZANidine (ZANAFLEX) 4 MG tablet Take by mouth in the morning and at bedtime.     Allergies:   Fentanyl, Iodine, Pravastatin, and Sulfa antibiotics   Social History   Socioeconomic History   Marital status: Widowed    Spouse name: Not on file   Number of children: 2   Years of education: Not on file   Highest education level: Not on file  Occupational History   Occupation: disabled  Tobacco Use   Smoking status: Every Day    Packs/day: 1.00    Years: 48.00    Pack years: 48.00    Types: Cigarettes  Smokeless tobacco: Never   Tobacco comments:    e-sig  Substance and Sexual Activity   Alcohol use: No   Drug use: No   Sexual activity: Not on file  Other Topics Concern   Not on file  Social History Narrative   Not on file   Social Determinants of Health   Financial Resource Strain: Not on file  Food Insecurity: Not on file  Transportation Needs: Not on file  Physical Activity: Not on file  Stress: Not on file  Social Connections: Not on file    Social: widow for the past 8 years  Family History: The patient's family history includes Breast cancer in her paternal aunt; Colon polyps in her paternal grandmother; Heart attack in her father and mother; Heart disease in her maternal grandmother, paternal grandfather, and paternal grandmother.  ROS:   Please see the history of present illness.     All other systems reviewed and are  negative.  EKGs/Labs/Other Studies Reviewed:    The following studies were reviewed today:  EKG:  EKG is  ordered today.  The ekg ordered today demonstrates  02/10/22: SR rate 74 RAE  Recent Labs: No results found for requested labs within last 8760 hours.  Recent Lipid Panel No results found for: CHOL, TRIG, HDL, CHOLHDL, VLDL, LDLCALC, LDLDIRECT      Physical Exam:    VS:  BP 130/60   Pulse 74   Ht 5\' 2"  (1.575 m)   Wt 119 lb (54 kg)   SpO2 95%   BMI 21.77 kg/m     Wt Readings from Last 3 Encounters:  02/10/22 119 lb (54 kg)  01/25/20 113 lb (51.3 kg)  05/02/13 120 lb (54.4 kg)    Gen: no distress   Neck: No JVD Cardiac: No Rubs or Gallops, no Murmur, RRR +2 radial pulses Respiratory: Mild bilateral expiratory wheeze, normal effort, normal  respiratory rate GI: Soft, nontender, non-distended  MS: No  edema;  moves all extremities Integument: Skin feels warm Neuro:  At time of evaluation, alert and oriented to person/place/time/situation  Psych: Normal affect, patient feels well  ASSESSMENT:    1. Chest pain of uncertain etiology   2. Essential hypertension   3. Tobacco abuse    PLAN:    Exertional chest discomfort, chest pain HTN Aortic atherosclerosis, LAD and RCA CAC HLD Active smoking Negative stress test in 2000. - Exercise NM Stress Test (she has contrast allergy) - zetia 10 mg added to ASA 81 and lipitor 20 (she had myalgias on 40 mg) Continue metoprolol 12.5 mg PO daily for now) - discussed smoking cessation, reviewed CT with patient and friend      Shared Decision Making/Informed Consent The risks [chest pain, shortness of breath, cardiac arrhythmias, dizziness, blood pressure fluctuations, myocardial infarction, stroke/transient ischemic attack, nausea, vomiting, allergic reaction, radiation exposure, metallic taste sensation and life-threatening complications (estimated to be 1 in 10,000)], benefits (risk stratification, diagnosing coronary  artery disease, treatment guidance) and alternatives of a nuclear stress test were discussed in detail with Heather Stevens and she agrees to proceed.    Medication Adjustments/Labs and Tests Ordered: Current medicines are reviewed at length with the patient today.  Concerns regarding medicines are outlined above.  Orders Placed This Encounter  Procedures   Cardiac Stress Test: Informed Consent Details: Physician/Practitioner Attestation; Transcribe to consent form and obtain patient signature   MYOCARDIAL PERFUSION IMAGING   Meds ordered this encounter  Medications   ezetimibe (ZETIA) 10 MG tablet  Sig: Take 1 tablet (10 mg total) by mouth daily.    Dispense:  90 tablet    Refill:  3    Patient Instructions  Medication Instructions:  Your physician has recommended you make the following change in your medication: START: ezetimibe (Zetia) 10 mg by mouth once a day   *If you need a refill on your cardiac medications before your next appointment, please call your pharmacy*   Lab Work: Fasting lipid panel at next office visit If you have labs (blood work) drawn today and your tests are completely normal, you will receive your results only by: MyChart Message (if you have MyChart) OR A paper copy in the mail If you have any lab test that is abnormal or we need to change your treatment, we will call you to review the results.   Testing/Procedures: Your physician has requested that you have en exercise stress myoview. For further information please visit [email or web address] Please follow instructions listed below.   You are scheduled for a Myocardial Perfusion Imaging Study. Please arrive 15 minutes prior to your appointment time for registration and insurance purposes.   The test will take approximately 3 to 4 hours to complete; you may bring reading material.  If someone comes with you to your appointment, they will need to remain in the main lobby due to limited space in the  testing area.    How to prepare for your Myocardial Perfusion Test: Do not eat or drink 3 hours prior to your test, except you may have water. Do not consume products containing caffeine (regular or decaffeinated) 12 hours prior to your test. (ex: coffee, chocolate, sodas, tea). Do not take metoprolol succinate 24 hours prior to the test.  Do wear comfortable clothes (no dresses or overalls) and walking shoes, tennis shoes preferred (No heels or open toe shoes are allowed). Do NOT wear cologne, perfume, aftershave, or lotions (deodorant is allowed). If these instructions are not followed, your test will have to be rescheduled.  If you cannot keep your appointment, please provide 24 hours notification to the Nuclear Lab, to avoid a possible $50 charge to your account.        Follow-Up: At [facility], you and your health needs are our priority.  As part of our continuing mission to provide you with exceptional heart care, we have created designated Provider Care Teams.  These Care Teams include your primary Cardiologist (physician) and Advanced Practice Providers (APPs -  Physician Assistants and Nurse Practitioners) who all work together to provide you with the care you need, when you need it.    Your next appointment:   3-4  month(s)  The format for your next appointment:   In Person  Provider:   Christell Constant, MD       Signed, Christell Constant, MD  02/10/2022 3:07 PM    [facility]

## 2022-02-10 ENCOUNTER — Encounter: Payer: Self-pay | Admitting: Internal Medicine

## 2022-02-10 ENCOUNTER — Other Ambulatory Visit: Payer: Self-pay

## 2022-02-10 ENCOUNTER — Ambulatory Visit (INDEPENDENT_AMBULATORY_CARE_PROVIDER_SITE_OTHER): Payer: Medicare Other | Admitting: Internal Medicine

## 2022-02-10 VITALS — BP 130/60 | HR 74 | Ht 62.0 in | Wt 119.0 lb

## 2022-02-10 DIAGNOSIS — I10 Essential (primary) hypertension: Secondary | ICD-10-CM | POA: Diagnosis not present

## 2022-02-10 DIAGNOSIS — R079 Chest pain, unspecified: Secondary | ICD-10-CM | POA: Insufficient documentation

## 2022-02-10 DIAGNOSIS — Z720 Tobacco use: Secondary | ICD-10-CM

## 2022-02-10 MED ORDER — EZETIMIBE 10 MG PO TABS: 10.0000 mg | ORAL_TABLET | Freq: Every day | ORAL | 3 refills | Status: DC

## 2022-02-10 NOTE — Patient Instructions (Addendum)
Medication Instructions:  ?Your physician has recommended you make the following change in your medication: START: ezetimibe (Zetia) 10 mg by mouth once a day  ? ?*If you need a refill on your cardiac medications before your next appointment, please call your pharmacy* ? ? ?Lab Work: ?Fasting lipid panel at next office visit ?If you have labs (blood work) drawn today and your tests are completely normal, you will receive your results only by: ?MyChart Message (if you have MyChart) OR ?A paper copy in the mail ?If you have any lab test that is abnormal or we need to change your treatment, we will call you to review the results. ? ? ?Testing/Procedures: ?Your physician has requested that you have en exercise stress myoview. For further information please visit [email or web address]. Please follow instructions listed below.  ? ?You are scheduled for a Myocardial Perfusion Imaging Study. ?Please arrive 15 minutes prior to your appointment time for registration and insurance purposes. ?  ?The test will take approximately 3 to 4 hours to complete; you may bring reading material.  If someone comes with you to your appointment, they will need to remain in the main lobby due to limited space in the testing area.  ?  ?How to prepare for your Myocardial Perfusion Test: ?Do not eat or drink 3 hours prior to your test, except you may have water. ?Do not consume products containing caffeine (regular or decaffeinated) 12 hours prior to your test. (ex: coffee, chocolate, sodas, tea). ?Do not take metoprolol succinate 24 hours prior to the test.  ?Do wear comfortable clothes (no dresses or overalls) and walking shoes, tennis shoes preferred (No heels or open toe shoes are allowed). ?Do NOT wear cologne, perfume, aftershave, or lotions (deodorant is allowed). ?If these instructions are not followed, your test will have to be rescheduled. ? ?If you cannot keep your appointment, please provide 24 hours notification to the Nuclear Lab, to  avoid a possible $50 charge to your account.  ?   ? ? ? ?Follow-Up: ?At [facility], you and your health needs are our priority.  As part of our continuing mission to provide you with exceptional heart care, we have created designated Provider Care Teams.  These Care Teams include your primary Cardiologist (physician) and Advanced Practice Providers (APPs -  Physician Assistants and Nurse Practitioners) who all work together to provide you with the care you need, when you need it. ?  ? ?Your next appointment:   ?3-4  month(s) ? ?The format for your next appointment:   ?In Person ? ?Provider:   ?Werner Lean, MD   ? ? ?

## 2022-02-11 NOTE — Addendum Note (Signed)
Addended by: Janan Halter F on: 02/11/2022 12:14 PM ? ? Modules accepted: Orders ? ?

## 2022-02-18 ENCOUNTER — Telehealth (HOSPITAL_COMMUNITY): Payer: Self-pay

## 2022-02-18 NOTE — Telephone Encounter (Signed)
Spoke with the patient, detailed instructions given. She stated that she would be here for her test. Asked to call back with any questions. S.Neiman Roots EMTP 

## 2022-02-20 ENCOUNTER — Other Ambulatory Visit: Payer: Self-pay

## 2022-02-20 ENCOUNTER — Ambulatory Visit (HOSPITAL_COMMUNITY): Payer: Medicare Other | Attending: Cardiology

## 2022-02-20 DIAGNOSIS — R079 Chest pain, unspecified: Secondary | ICD-10-CM | POA: Diagnosis not present

## 2022-02-20 LAB — MYOCARDIAL PERFUSION IMAGING
Angina Index: 0
Duke Treadmill Score: 6
Estimated workload: 7
Exercise duration (min): 5 min
Exercise duration (sec): 45 s
LV dias vol: 42 mL (ref 46–106)
LV sys vol: 9 mL
MPHR: 152 {beats}/min
Nuc Stress EF: 80 %
Peak HR: 131 {beats}/min
Percent HR: 86 %
RPE: 19
Rest HR: 69 {beats}/min
Rest Nuclear Isotope Dose: 10.2 mCi
SDS: 5
SRS: 1
SSS: 6
ST Depression (mm): 0 mm
Stress Nuclear Isotope Dose: 33 mCi
TID: 1.17

## 2022-02-20 MED ORDER — TECHNETIUM TC 99M TETROFOSMIN IV KIT
10.2000 | PACK | Freq: Once | INTRAVENOUS | Status: AC | PRN
  Administered 2022-02-20: 10.2 via INTRAVENOUS
  Filled 2022-02-20: qty 11

## 2022-02-20 MED ORDER — TECHNETIUM TC 99M TETROFOSMIN IV KIT
33.0000 | PACK | Freq: Once | INTRAVENOUS | Status: AC | PRN
  Administered 2022-02-20: 33 via INTRAVENOUS
  Filled 2022-02-20: qty 33

## 2022-02-26 ENCOUNTER — Telehealth: Payer: Self-pay

## 2022-02-26 MED ORDER — NITROGLYCERIN 0.4 MG SL SUBL: 0.4000 mg | SUBLINGUAL_TABLET | SUBLINGUAL | 3 refills | Status: DC | PRN

## 2022-02-26 NOTE — Telephone Encounter (Signed)
-----   Message from Werner Lean, MD sent at 02/21/2022 12:59 PM EDT ----- ?Results: ?No evidence of ischemia cannot rule out infarction ?Plan: ?PRN nitroglycerin ? ?Werner Lean, MD ? ?

## 2022-02-26 NOTE — Telephone Encounter (Signed)
The patient has been notified of the result and verbalized understanding.  All questions (if any) were answered. ?Precious Gilding, RN 02/26/2022 2:22 PM   ?

## 2022-04-15 ENCOUNTER — Inpatient Hospital Stay: Admission: RE | Admit: 2022-04-15 | Payer: Medicare Other | Source: Ambulatory Visit

## 2022-04-24 ENCOUNTER — Other Ambulatory Visit: Payer: Self-pay

## 2022-04-24 DIAGNOSIS — Z87891 Personal history of nicotine dependence: Secondary | ICD-10-CM

## 2022-04-24 DIAGNOSIS — F17210 Nicotine dependence, cigarettes, uncomplicated: Secondary | ICD-10-CM

## 2022-04-24 DIAGNOSIS — Z122 Encounter for screening for malignant neoplasm of respiratory organs: Secondary | ICD-10-CM

## 2022-04-25 ENCOUNTER — Inpatient Hospital Stay: Admission: RE | Admit: 2022-04-25 | Payer: Medicare Other | Source: Ambulatory Visit

## 2022-05-01 DIAGNOSIS — Z139 Encounter for screening, unspecified: Secondary | ICD-10-CM | POA: Diagnosis not present

## 2022-05-01 DIAGNOSIS — E785 Hyperlipidemia, unspecified: Secondary | ICD-10-CM | POA: Diagnosis not present

## 2022-05-01 DIAGNOSIS — Z9181 History of falling: Secondary | ICD-10-CM | POA: Diagnosis not present

## 2022-05-01 DIAGNOSIS — Z0000 Encounter for general adult medical examination without abnormal findings: Secondary | ICD-10-CM | POA: Diagnosis not present

## 2022-05-01 DIAGNOSIS — Z1331 Encounter for screening for depression: Secondary | ICD-10-CM | POA: Diagnosis not present

## 2022-05-14 ENCOUNTER — Ambulatory Visit
Admission: RE | Admit: 2022-05-14 | Discharge: 2022-05-14 | Disposition: A | Discharge status: Home / Self Care | Payer: Medicare Other | Source: Ambulatory Visit | Attending: Physician Assistant | Admitting: Physician Assistant

## 2022-05-14 DIAGNOSIS — F17210 Nicotine dependence, cigarettes, uncomplicated: Secondary | ICD-10-CM

## 2022-05-14 DIAGNOSIS — Z87891 Personal history of nicotine dependence: Secondary | ICD-10-CM

## 2022-05-14 DIAGNOSIS — Z122 Encounter for screening for malignant neoplasm of respiratory organs: Secondary | ICD-10-CM

## 2022-05-14 DIAGNOSIS — J432 Centrilobular emphysema: Secondary | ICD-10-CM | POA: Diagnosis not present

## 2022-05-14 DIAGNOSIS — K7689 Other specified diseases of liver: Secondary | ICD-10-CM | POA: Diagnosis not present

## 2022-05-14 DIAGNOSIS — I2510 Atherosclerotic heart disease of native coronary artery without angina pectoris: Secondary | ICD-10-CM | POA: Diagnosis not present

## 2022-05-16 ENCOUNTER — Other Ambulatory Visit: Payer: Self-pay

## 2022-05-16 DIAGNOSIS — Z122 Encounter for screening for malignant neoplasm of respiratory organs: Secondary | ICD-10-CM

## 2022-05-16 DIAGNOSIS — F17210 Nicotine dependence, cigarettes, uncomplicated: Secondary | ICD-10-CM

## 2022-05-16 DIAGNOSIS — Z87891 Personal history of nicotine dependence: Secondary | ICD-10-CM

## 2022-06-22 NOTE — Progress Notes (Deleted)
Cardiology Office Note:    Date:  06/22/2022   ID:  Heather Stevens, DOB 08/15/1951, MRN [id number]  PCP:  Cyndi Bender, PA-C   [facility] Providers Cardiologist:  Werner Lean, MD     Referring MD: Cyndi Bender, PA-C   CC:  Follow up stress test  History of Present Illness:    Heather Stevens is a 71 y.o. female with a hx of HTN, HLD, Current tobacco abuse, LAD and RCA CAC and aortic atherosclerosis. 2023: have negative stress test  given PRN nitro for lighting-bolts  Patient notes that she is doing ***.   Since last visit notes *** . There are no*** interval hospital/ED visit.    No chest pain or pressure ***.  No SOB/DOE*** and no PND/Orthopnea***.  No weight gain or leg swelling***.  No palpitations or syncope ***.  Ambulatory blood pressure ***.   Past Medical History:  Diagnosis Date   Anxiety    Arthritis    Cervicalgia    Chronic pain    Gallstones    HLD (hyperlipidemia)    HTN (hypertension)    IBS (irritable bowel syndrome)    Myalgia    Osteoporosis    Pneumonia     Past Surgical History:  Procedure Laterality Date   BLADDER SURGERY     CESAREAN SECTION     CHOLECYSTECTOMY     VAGINAL HYSTERECTOMY      Current Medications: No outpatient medications have been marked as taking for the 06/24/22 encounter (Appointment) with Werner Lean, MD.     Allergies:   Fentanyl, Iodine, Pravastatin, and Sulfa antibiotics   Social History   Socioeconomic History   Marital status: Widowed    Spouse name: Not on file   Number of children: 2   Years of education: Not on file   Highest education level: Not on file  Occupational History   Occupation: disabled  Tobacco Use   Smoking status: Every Day    Packs/day: 1.00    Years: 48.00    Total pack years: 48.00    Types: Cigarettes   Smokeless tobacco: Never   Tobacco comments:    e-sig  Substance and Sexual Activity   Alcohol use: No   Drug use: No   Sexual activity: Not  on file  Other Topics Concern   Not on file  Social History Narrative   Not on file   Social Determinants of Health   Financial Resource Strain: Not on file  Food Insecurity: Not on file  Transportation Needs: Not on file  Physical Activity: Not on file  Stress: Not on file  Social Connections: Not on file    Social: widow for the past 8 years  Family History: The patient's family history includes Breast cancer in her paternal aunt; Colon polyps in her paternal grandmother; Heart attack in her father and mother; Heart disease in her maternal grandmother, paternal grandfather, and paternal grandmother.  ROS:   Please see the history of present illness.     All other systems reviewed and are negative.  EKGs/Labs/Other Studies Reviewed:    The following studies were reviewed today:  EKG:   02/10/22: SR rate 74 RAE  Recent Labs: No results found for requested labs within last 365 days.  Recent Lipid Panel No results found for: "CHOL", "TRIG", "HDL", "CHOLHDL", "VLDL", "LDLCALC", "LDLDIRECT"      Physical Exam:    VS:  There were no vitals taken for this visit.  Wt Readings from Last 3 Encounters:  02/20/22 119 lb (54 kg)  02/10/22 119 lb (54 kg)  01/25/20 113 lb (51.3 kg)    Gen: *** distress, *** obese/well nourished/malnourished   Neck: No JVD, *** carotid bruit Ears: *** Frank Sign Cardiac: No Rubs or Gallops, *** Murmur, ***cardia, *** radial pulses Respiratory: Clear to auscultation bilaterally, *** effort, ***  respiratory rate GI: Soft, nontender, non-distended *** MS: No *** edema; *** moves all extremities Integument: Skin feels *** Neuro:  At time of evaluation, alert and oriented to person/place/time/situation *** Psych: Normal affect, patient feels ***   ASSESSMENT:    No diagnosis found.  PLAN:    HTN Aortic atherosclerosis, LAD and RCA CAC HLD Active smoking - zetia 10 mg added to ASA 81 and lipitor 20 (she had myalgias on 40  mg) Continue metoprolol 12.5 mg PO daily for now) - discussed smoking cessation, reviewed CT with patient and friend  One year me or APP  Medication Adjustments/Labs and Tests Ordered: Current medicines are reviewed at length with the patient today.  Concerns regarding medicines are outlined above.  No orders of the defined types were placed in this encounter.  No orders of the defined types were placed in this encounter.   There are no Patient Instructions on file for this visit.   Signed, Werner Lean, MD  06/22/2022 3:42 PM    [place]

## 2022-06-24 ENCOUNTER — Ambulatory Visit: Payer: Medicare Other | Admitting: Internal Medicine

## 2022-06-29 IMAGING — CT CT CHEST LUNG CANCER SCREENING LOW DOSE W/O CM
1 series · 10 of 10 positions shown, 13 images · non-contrast
Comparison: 04/15/2021

CLINICAL DATA: Fifty pack-year smoking history/current smoker



[ct lung segmentation data · axial · 0.65mm/px · z∈[+877,+877]mm · 10 of 332 frames shown]
[frame 1/332  mediastinal]
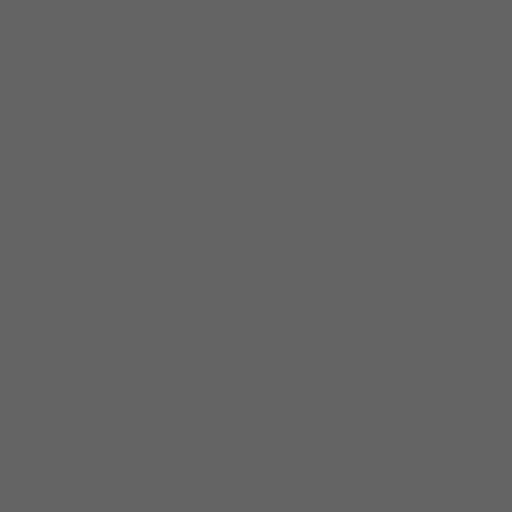
[frame 1/332  lung]
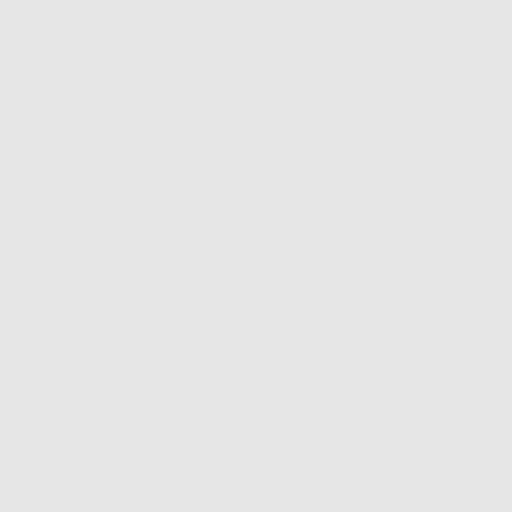
[frame 37/332  lung]
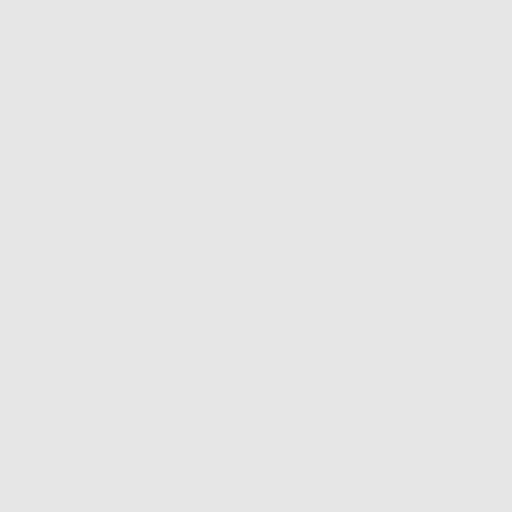
[frame 74/332  lung]
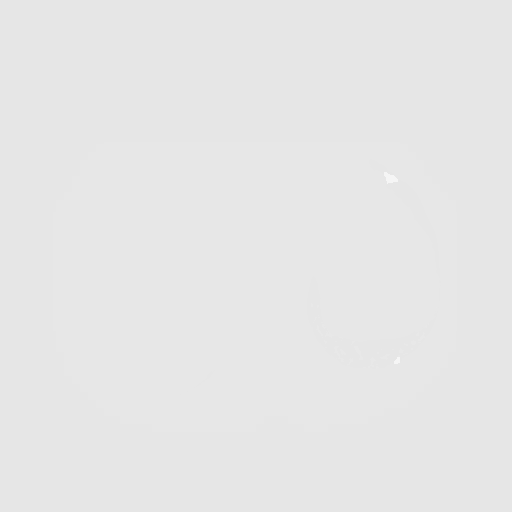
[frame 111/332  lung]
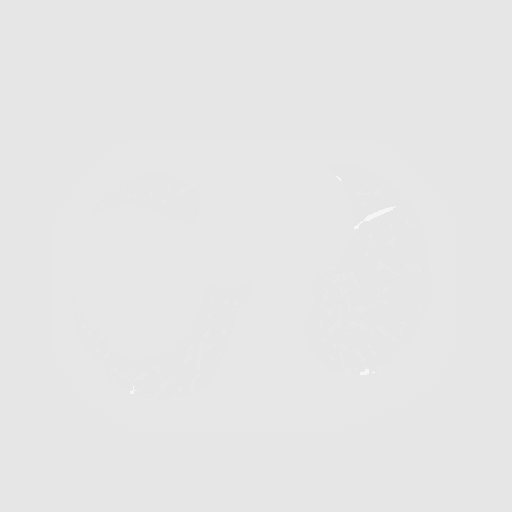
[frame 148/332  mediastinal]
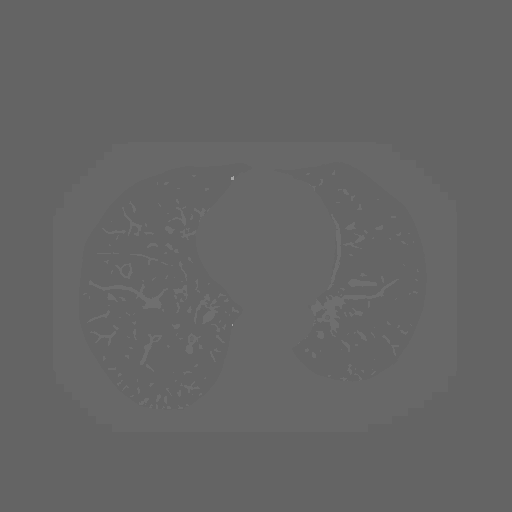
[frame 148/332  lung]
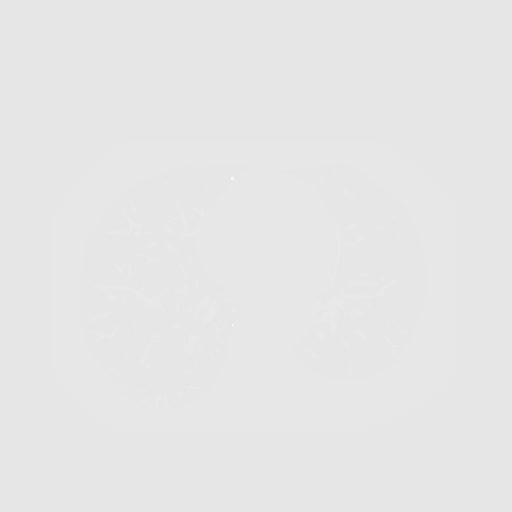
[frame 184/332  lung]
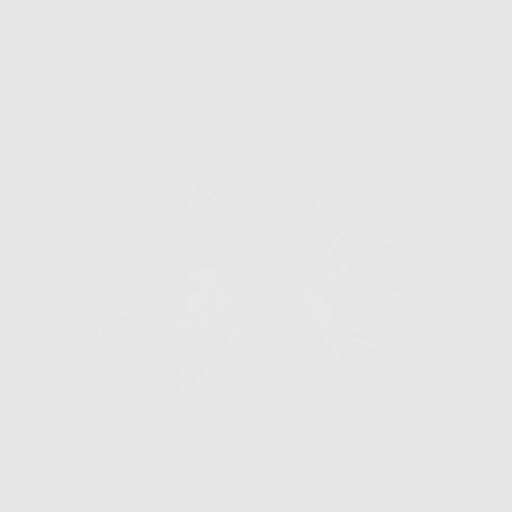
[frame 221/332  lung]
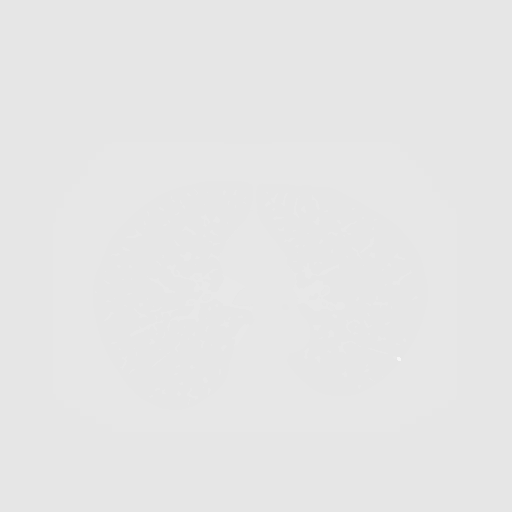
[frame 258/332  lung]
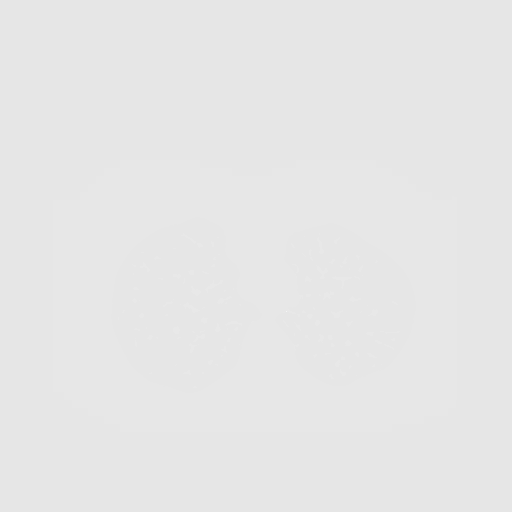
[frame 295/332  mediastinal]
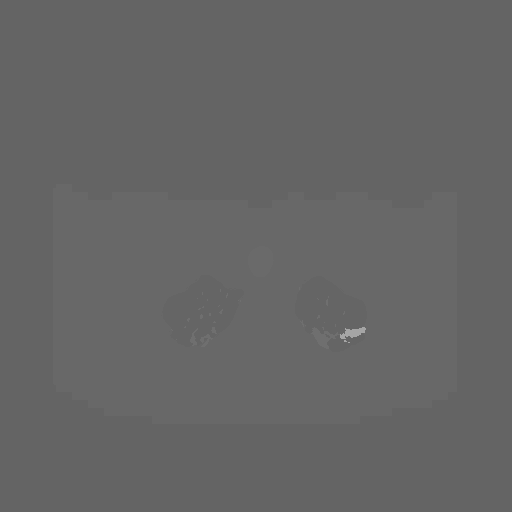
[frame 295/332  lung]
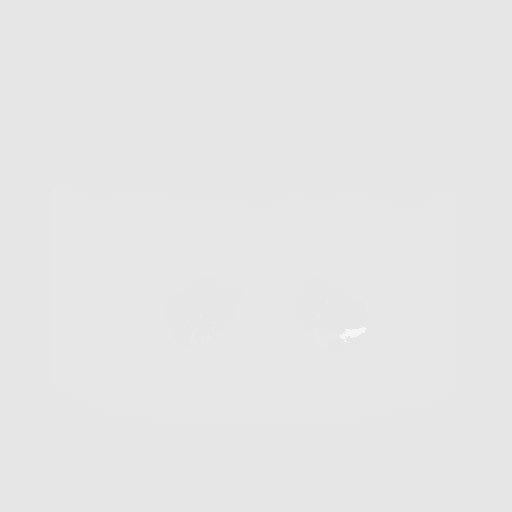
[frame 332/332  lung]
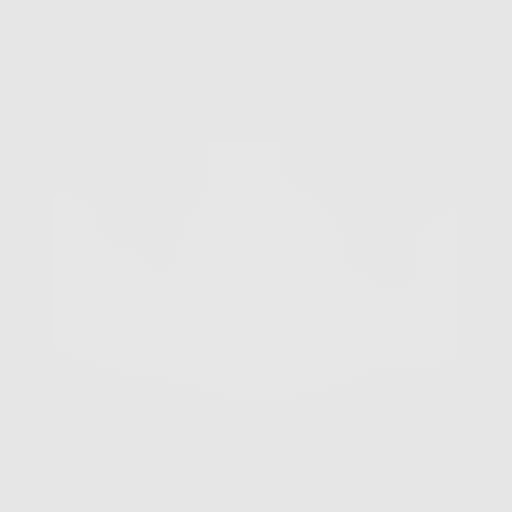

[10 of 10 positions shown; findings below may reference images not displayed]

FINDINGS: Cardiovascular: Aortic atherosclerosis. Normal heart size, without
pericardial effusion. Lad coronary artery calcification.

Mediastinum/Nodes: No mediastinal or definite hilar adenopathy,
given limitations of unenhanced CT.

Lungs/Pleura: No pleural fluid. Moderate centrilobular emphysema.
Secretions within the right-sided dependent endobronchial tree.
Biapical pleuroparenchymal scarring.

Similar isolated right middle lobe pulmonary nodule of volume
derived equivalent diameter 2.1 mm.

Upper Abdomen: Hepatic cysts, the largest of which is in segment 2
at 3.4 cm, similar. Cholecystectomy. Normal imaged portions of the
spleen, pancreas, adrenal glands, kidneys. The proximal stomach
appears thick walled, most likely due to underdistention. Similar.
Abdominal aortic atherosclerosis.

Musculoskeletal: Mild convex right thoracic spine curvature.
IMPRESSION: 1. Lung-RADS 2, benign appearance or behavior. Continue annual
screening with low-dose chest CT without contrast in 12 months.
2. Aortic Atherosclerosis (52RCF-W7K.K) and Emphysema (52RCF-QBH.M).
Coronary artery atherosclerosis.

## 2022-07-04 DIAGNOSIS — S91331A Puncture wound without foreign body, right foot, initial encounter: Secondary | ICD-10-CM | POA: Diagnosis not present

## 2022-08-11 ENCOUNTER — Ambulatory Visit: Payer: Medicare Other | Admitting: Internal Medicine

## 2022-09-19 ENCOUNTER — Ambulatory Visit: Payer: Medicare Other | Admitting: Internal Medicine

## 2022-09-23 DIAGNOSIS — Z720 Tobacco use: Secondary | ICD-10-CM | POA: Diagnosis not present

## 2022-09-23 DIAGNOSIS — E7800 Pure hypercholesterolemia, unspecified: Secondary | ICD-10-CM | POA: Diagnosis not present

## 2022-09-23 DIAGNOSIS — I10 Essential (primary) hypertension: Secondary | ICD-10-CM | POA: Diagnosis not present

## 2022-09-23 DIAGNOSIS — I2510 Atherosclerotic heart disease of native coronary artery without angina pectoris: Secondary | ICD-10-CM | POA: Diagnosis not present

## 2022-09-23 DIAGNOSIS — J439 Emphysema, unspecified: Secondary | ICD-10-CM | POA: Diagnosis not present

## 2022-09-23 DIAGNOSIS — M5000 Cervical disc disorder with myelopathy, unspecified cervical region: Secondary | ICD-10-CM | POA: Diagnosis not present

## 2022-09-23 DIAGNOSIS — I700 Atherosclerosis of aorta: Secondary | ICD-10-CM | POA: Diagnosis not present

## 2022-09-23 DIAGNOSIS — F418 Other specified anxiety disorders: Secondary | ICD-10-CM | POA: Diagnosis not present

## 2022-09-25 ENCOUNTER — Encounter: Payer: Self-pay | Admitting: Internal Medicine

## 2022-09-25 ENCOUNTER — Ambulatory Visit: Payer: Medicare Other | Attending: Internal Medicine | Admitting: Internal Medicine

## 2022-09-25 VITALS — BP 118/58 | HR 81 | Ht 62.0 in | Wt 123.8 lb

## 2022-09-25 DIAGNOSIS — I10 Essential (primary) hypertension: Secondary | ICD-10-CM | POA: Insufficient documentation

## 2022-09-25 DIAGNOSIS — I2510 Atherosclerotic heart disease of native coronary artery without angina pectoris: Secondary | ICD-10-CM | POA: Insufficient documentation

## 2022-09-25 DIAGNOSIS — I2584 Coronary atherosclerosis due to calcified coronary lesion: Secondary | ICD-10-CM | POA: Diagnosis not present

## 2022-09-25 DIAGNOSIS — M791 Myalgia, unspecified site: Secondary | ICD-10-CM | POA: Insufficient documentation

## 2022-09-25 DIAGNOSIS — Z720 Tobacco use: Secondary | ICD-10-CM | POA: Diagnosis not present

## 2022-09-25 DIAGNOSIS — T466X5A Adverse effect of antihyperlipidemic and antiarteriosclerotic drugs, initial encounter: Secondary | ICD-10-CM | POA: Diagnosis not present

## 2022-09-25 DIAGNOSIS — E782 Mixed hyperlipidemia: Secondary | ICD-10-CM

## 2022-09-25 DIAGNOSIS — I700 Atherosclerosis of aorta: Secondary | ICD-10-CM | POA: Diagnosis not present

## 2022-09-25 NOTE — Patient Instructions (Signed)
Medication Instructions:  Your physician recommends that you continue on your current medications as directed. Please refer to the Current Medication list given to you today.  *If you need a refill on your cardiac medications before your next appointment, please call your pharmacy*   Lab Work: NONE If you have labs (blood work) drawn today and your tests are completely normal, you will receive your results only by: [place] (if you have MyChart) OR A paper copy in the mail If you have any lab test that is abnormal or we need to change your treatment, we will call you to review the results.   Testing/Procedures: NONE   Follow-Up: At [facility], you and your health needs are our priority.  As part of our continuing mission to provide you with exceptional heart care, we have created designated Provider Care Teams.  These Care Teams include your primary Cardiologist (physician) and Advanced Practice Providers (APPs -  Physician Assistants and Nurse Practitioners) who all work together to provide you with the care you need, when you need it.  We recommend signing up for the patient portal called "MyChart".  Sign up information is provided on this After Visit Summary.  MyChart is used to connect with patients for Virtual Visits (Telemedicine).  Patients are able to view lab/test results, encounter notes, upcoming appointments, etc.  Non-urgent messages can be sent to your provider as well.   To learn more about what you can do with MyChart, go to [email or web address].    Your next appointment:   1 year(s)  The format for your next appointment:   In Person  Provider:   Werner Lean, MD     Important Information About Sugar

## 2022-09-25 NOTE — Progress Notes (Signed)
Cardiology Office Note:    Date:  09/25/2022   ID:  Heather Stevens, DOB 05-17-1951, MRN [id number]  PCP:  Heather Bender, PA-C   [facility] Providers Cardiologist:  Heather Lean, MD     Referring MD: Heather Bender, PA-C   CC: F/u CAC  History of Present Illness:    Heather Stevens is a 71 y.o. female with a hx of HTN, HLD, Current tobacco abuse, LAD and RCA CAC and aortic atherosclerosis. In interim had negative stress test.  Patient notes that she is doing well.   Since last visit notes she is Stevens to 2 packs a week from a pack a day. . There are no interval hospital/ED visit.   Has used one nitroglycerin since last visit.  Three weeks had nocturnal CP that resolved.   No SOB/DOE and no PND/Orthopnea.  No weight gain or leg swelling.  No palpitations or syncope. PCP LDL 70, other lipids looking good.  Ambulatory blood pressure not done.  She denies prior issues with Qtc prolongation but notes that her citalopram was decreased for cardiac reasons.   Past Medical History:  Diagnosis Date   Anxiety    Arthritis    Cervicalgia    Chronic pain    Gallstones    HLD (hyperlipidemia)    HTN (hypertension)    IBS (irritable bowel syndrome)    Myalgia    Osteoporosis    Pneumonia     Past Surgical History:  Procedure Laterality Date   BLADDER SURGERY     CESAREAN SECTION     CHOLECYSTECTOMY     VAGINAL HYSTERECTOMY      Current Medications: Current Meds  Medication Sig   amitriptyline (ELAVIL) 25 MG tablet Take 25 mg by mouth at bedtime.   aspirin 81 MG tablet Take 81 mg by mouth daily.   atorvastatin (LIPITOR) 20 MG tablet Take 20 mg by mouth daily.   Calcium Carbonate-Vitamin D 600-400 MG-UNIT chew tablet Chew 1 tablet by mouth 3 (three) times daily with meals.   citalopram (CELEXA) 20 MG tablet daily.   ezetimibe (ZETIA) 10 MG tablet Take 1 tablet (10 mg total) by mouth daily.   ibuprofen (ADVIL) 200 MG tablet Take 400 mg by mouth every 6 (six)  hours as needed for headache, mild pain or moderate pain.   loperamide (IMODIUM A-D) 2 MG tablet Take 2 mg by mouth as needed for diarrhea or loose stools (IBS).   metoprolol succinate (TOPROL-XL) 25 MG 24 hr tablet Take 12.5 mg by mouth daily.   nitroGLYCERIN (NITROSTAT) 0.4 MG SL tablet Place 1 tablet (0.4 mg total) under the tongue every 5 (five) minutes as needed for chest pain (can take up to 3 tablets).   tiZANidine (ZANAFLEX) 4 MG tablet Take by mouth in the morning and at bedtime.     Allergies:   Fentanyl, Iodine, Pravastatin, and Sulfa antibiotics   Social History   Socioeconomic History   Marital status: Widowed    Spouse name: Not on file   Number of children: 2   Years of education: Not on file   Highest education level: Not on file  Occupational History   Occupation: disabled  Tobacco Use   Smoking status: Every Day    Packs/day: 1.00    Years: 48.00    Total pack years: 48.00    Types: Cigarettes   Smokeless tobacco: Never   Tobacco comments:    e-sig  Substance and Sexual Activity   Alcohol  use: No   Drug use: No   Sexual activity: Not on file  Other Topics Concern   Not on file  Social History Narrative   Not on file   Social Determinants of Health   Financial Resource Strain: Not on file  Food Insecurity: Not on file  Transportation Needs: Not on file  Physical Activity: Not on file  Stress: Not on file  Social Connections: Not on file    Social: Widow for the past 9 years  Family History: The patient's family history includes Breast cancer in her paternal aunt; Colon polyps in her paternal grandmother; Heart attack in her father and mother; Heart disease in her maternal grandmother, paternal grandfather, and paternal grandmother.  ROS:   Please see the history of present illness.     All other systems reviewed and are negative.  EKGs/Labs/Other Studies Reviewed:    The following studies were reviewed today:  EKG 02/10/22: SR rate 74 RAE  Qtc 448  GATED SPECT MYO PERF W/EXERCISE STRESS 1D 02/20/2022  Narrative   Findings are consistent with no prior ischemia. The study is low risk.   No ST deviation was noted.   LV perfusion is equivocal.   Left ventricular function is normal. Nuclear stress EF: 80 %. The left ventricular ejection fraction is hyperdynamic (>65%). End diastolic cavity size is normal. End systolic cavity size is normal.   Prior study not available for comparison.  Mild fixed mid-apical perfusion defect with hyperdynamic motion, most consistent with artifact (suspect breast attenuation). Low risk study without evidence of ischemia.     Recent Labs: No results found for requested labs within last 365 days.  Recent Lipid Panel No results found for: "CHOL", "TRIG", "HDL", "CHOLHDL", "VLDL", "LDLCALC", "LDLDIRECT"      Physical Exam:    VS:  BP (!) 118/58   Pulse 81   Ht '5\' 2"'$  (1.575 m)   Wt 123 lb 12.8 oz (56.2 kg)   SpO2 96%   BMI 22.64 kg/m     Wt Readings from Last 3 Encounters:  09/25/22 123 lb 12.8 oz (56.2 kg)  02/20/22 119 lb (54 kg)  02/10/22 119 lb (54 kg)    Gen: no distress   Neck: No JVD Cardiac: No Rubs or Gallops, no murmur, RRR +2 radial pulses Respiratory: Mild bilateral expiratory wheeze, normal effort, normal  respiratory rate GI: Soft, nontender, non-distended  MS: No  edema;  moves all extremities Integument: Skin feels warm Neuro:  At time of evaluation, alert and oriented to person/place/time/situation  Psych: Normal affect, patient feels well  ASSESSMENT:    1. Aortic atherosclerosis ([place])   2. Coronary artery calcification   3. Mixed hyperlipidemia   4. Tobacco abuse   5. Myalgia due to statin   6. Essential hypertension     PLAN:    Aortic atherosclerosis, LAD and RCA CAC HLD Active smoking Contrast allergy Statin myalgia on 40 mg lipitor - continue asa 81 mg Po daily - LDL at goal with zetia 10 mg and lipitor 20 mg - continue PRN nitro - discussion  smoking cessation   HTN Depression  - BP controlled on metoprolol  - Citalopram is not recommended for use in patients with congenital long QT syndrome, bradycardia, hypokalemia, or hypomagnesemia, recent acute myocardial infarction, or uncompensated heart failure; she has had none of these and this should be reasonable      Medication Adjustments/Labs and Tests Ordered: Current medicines are reviewed at length with the patient today.  Concerns regarding medicines are outlined above.  No orders of the defined types were placed in this encounter.  No orders of the defined types were placed in this encounter.   Patient Instructions  Medication Instructions:  Your physician recommends that you continue on your current medications as directed. Please refer to the Current Medication list given to you today.  *If you need a refill on your cardiac medications before your next appointment, please call your pharmacy*   Lab Work: NONE If you have labs (blood work) drawn today and your tests are completely normal, you will receive your results only by: [place] (if you have MyChart) OR A paper copy in the mail If you have any lab test that is abnormal or we need to change your treatment, we will call you to review the results.   Testing/Procedures: NONE   Follow-Up: At [facility], you and your health needs are our priority.  As part of our continuing mission to provide you with exceptional heart care, we have created designated Provider Care Teams.  These Care Teams include your primary Cardiologist (physician) and Advanced Practice Providers (APPs -  Physician Assistants and Nurse Practitioners) who all work together to provide you with the care you need, when you need it.  We recommend signing up for the patient portal called "MyChart".  Sign up information is provided on this After Visit Summary.  MyChart is used to connect with patients for Virtual Visits  (Telemedicine).  Patients are able to view lab/test results, encounter notes, upcoming appointments, etc.  Non-urgent messages can be sent to your provider as well.   To learn more about what you can do with MyChart, go to [email or web address].    Your next appointment:   1 year(s)  The format for your next appointment:   In Person  Provider:   Werner Lean, MD     Important Information About Sugar         Signed, Heather Lean, MD  09/25/2022 2:51 PM    [place]

## 2022-11-03 DIAGNOSIS — M791 Myalgia, unspecified site: Secondary | ICD-10-CM | POA: Diagnosis not present

## 2022-11-19 ENCOUNTER — Other Ambulatory Visit: Payer: Self-pay | Admitting: Physician Assistant

## 2022-11-19 DIAGNOSIS — Z1231 Encounter for screening mammogram for malignant neoplasm of breast: Secondary | ICD-10-CM

## 2023-01-14 ENCOUNTER — Ambulatory Visit: Payer: Medicare Other

## 2023-01-16 ENCOUNTER — Ambulatory Visit: Payer: Medicare Other

## 2023-02-13 ENCOUNTER — Ambulatory Visit: Payer: Medicare Other

## 2023-02-18 ENCOUNTER — Ambulatory Visit
Admission: RE | Admit: 2023-02-18 | Discharge: 2023-02-18 | Disposition: A | Discharge status: Home / Self Care | Payer: Medicare Other | Source: Ambulatory Visit | Attending: Physician Assistant | Admitting: Physician Assistant

## 2023-02-18 DIAGNOSIS — Z1231 Encounter for screening mammogram for malignant neoplasm of breast: Secondary | ICD-10-CM | POA: Diagnosis not present

## 2023-02-23 ENCOUNTER — Other Ambulatory Visit: Payer: Self-pay | Admitting: Internal Medicine

## 2023-03-04 ENCOUNTER — Other Ambulatory Visit: Payer: Self-pay

## 2023-03-04 DIAGNOSIS — Z87891 Personal history of nicotine dependence: Secondary | ICD-10-CM

## 2023-03-04 DIAGNOSIS — Z122 Encounter for screening for malignant neoplasm of respiratory organs: Secondary | ICD-10-CM

## 2023-03-04 DIAGNOSIS — F17210 Nicotine dependence, cigarettes, uncomplicated: Secondary | ICD-10-CM

## 2023-03-25 DIAGNOSIS — Z87891 Personal history of nicotine dependence: Secondary | ICD-10-CM | POA: Diagnosis not present

## 2023-03-25 DIAGNOSIS — M810 Age-related osteoporosis without current pathological fracture: Secondary | ICD-10-CM | POA: Diagnosis not present

## 2023-03-25 DIAGNOSIS — E7800 Pure hypercholesterolemia, unspecified: Secondary | ICD-10-CM | POA: Diagnosis not present

## 2023-03-25 DIAGNOSIS — F418 Other specified anxiety disorders: Secondary | ICD-10-CM | POA: Diagnosis not present

## 2023-03-25 DIAGNOSIS — I700 Atherosclerosis of aorta: Secondary | ICD-10-CM | POA: Diagnosis not present

## 2023-03-25 DIAGNOSIS — I2510 Atherosclerotic heart disease of native coronary artery without angina pectoris: Secondary | ICD-10-CM | POA: Diagnosis not present

## 2023-03-25 DIAGNOSIS — M5000 Cervical disc disorder with myelopathy, unspecified cervical region: Secondary | ICD-10-CM | POA: Diagnosis not present

## 2023-03-25 DIAGNOSIS — J439 Emphysema, unspecified: Secondary | ICD-10-CM | POA: Diagnosis not present

## 2023-03-25 DIAGNOSIS — I10 Essential (primary) hypertension: Secondary | ICD-10-CM | POA: Diagnosis not present

## 2023-04-27 ENCOUNTER — Other Ambulatory Visit: Payer: Self-pay | Admitting: Acute Care

## 2023-04-27 DIAGNOSIS — F17210 Nicotine dependence, cigarettes, uncomplicated: Secondary | ICD-10-CM

## 2023-04-27 DIAGNOSIS — Z87891 Personal history of nicotine dependence: Secondary | ICD-10-CM

## 2023-04-27 DIAGNOSIS — Z122 Encounter for screening for malignant neoplasm of respiratory organs: Secondary | ICD-10-CM

## 2023-05-01 DIAGNOSIS — M791 Myalgia, unspecified site: Secondary | ICD-10-CM | POA: Diagnosis not present

## 2023-05-18 ENCOUNTER — Ambulatory Visit
Admission: RE | Admit: 2023-05-18 | Discharge: 2023-05-18 | Disposition: A | Discharge status: Home / Self Care | Payer: Medicare Other | Source: Ambulatory Visit | Attending: Physician Assistant | Admitting: Physician Assistant

## 2023-05-18 DIAGNOSIS — F17210 Nicotine dependence, cigarettes, uncomplicated: Secondary | ICD-10-CM | POA: Diagnosis not present

## 2023-05-18 DIAGNOSIS — Z122 Encounter for screening for malignant neoplasm of respiratory organs: Secondary | ICD-10-CM

## 2023-05-18 DIAGNOSIS — Z87891 Personal history of nicotine dependence: Secondary | ICD-10-CM

## 2023-05-22 ENCOUNTER — Other Ambulatory Visit: Payer: Self-pay | Admitting: Acute Care

## 2023-05-22 ENCOUNTER — Telehealth: Payer: Self-pay | Admitting: Acute Care

## 2023-05-22 DIAGNOSIS — Z87891 Personal history of nicotine dependence: Secondary | ICD-10-CM

## 2023-05-22 DIAGNOSIS — Z122 Encounter for screening for malignant neoplasm of respiratory organs: Secondary | ICD-10-CM

## 2023-05-22 DIAGNOSIS — F17210 Nicotine dependence, cigarettes, uncomplicated: Secondary | ICD-10-CM

## 2023-05-22 NOTE — Telephone Encounter (Signed)
Called and spoke to patient. She states her results havent populated on MyChart yet and she was becoming concerned. Advised her the results just became available today and informed her of the delayed read times currently. I discussed her results with her (impression below). She mentioned she had not been sick recently but will still follow up with her PCP if anything changes. Patient aware of 12 month repeat scan. Order placed. Nothing further needed at this time.      IMPRESSION: 1. Lung-RADS 2, benign appearance or behavior. Continue annual screening with low-dose chest CT without contrast in 12 months. 2. New mild bibasilar ground-glass opacities, likely infectious or inflammatory. Recommend attention on follow-up. 3. Aortic Atherosclerosis (ICD10-I70.0) and Emphysema (ICD10-J43.9).

## 2023-05-22 NOTE — Telephone Encounter (Signed)
Pt. Calling wanting to go over her CT scan

## 2023-05-25 ENCOUNTER — Other Ambulatory Visit: Payer: Medicare Other

## 2023-05-28 DIAGNOSIS — J439 Emphysema, unspecified: Secondary | ICD-10-CM | POA: Diagnosis not present

## 2023-05-28 DIAGNOSIS — R918 Other nonspecific abnormal finding of lung field: Secondary | ICD-10-CM | POA: Diagnosis not present

## 2023-08-31 DIAGNOSIS — Z23 Encounter for immunization: Secondary | ICD-10-CM | POA: Diagnosis not present

## 2023-09-20 ENCOUNTER — Other Ambulatory Visit: Payer: Self-pay

## 2023-09-20 ENCOUNTER — Ambulatory Visit
Admission: RE | Admit: 2023-09-20 | Discharge: 2023-09-20 | Disposition: A | Discharge status: Home / Self Care | Payer: Medicare Other | Source: Ambulatory Visit | Attending: Internal Medicine | Admitting: Internal Medicine

## 2023-09-20 VITALS — BP 125/58 | HR 77 | Temp 98.4°F | Resp 18

## 2023-09-20 DIAGNOSIS — M7711 Lateral epicondylitis, right elbow: Secondary | ICD-10-CM

## 2023-09-20 NOTE — ED Triage Notes (Signed)
Rt forearm pain since Tuesday- states area is swollen. Denies injury. Pain is worse with certain movements

## 2023-09-20 NOTE — Discharge Instructions (Signed)
It appears that you have tennis elbow.  A brace has been applied.  Take this off while sleeping.  Follow-up with orthopedist if pain persist or worsens.

## 2023-09-20 NOTE — ED Provider Notes (Addendum)
[facility]    CSN: [id number] Arrival date & time: 09/20/23  1248      History   Chief Complaint Chief Complaint  Patient presents with   Arm Pain    HPI Heather Stevens is a 72 y.o. female.   Patient presents with right forearm and elbow pain that started about 4 to 5 days ago.  Denies any injury to the area.  Denies any obvious repetitive movements.  Patient has taken Advil for pain and applied lidocaine patch.  Denies numbness or tingling.   Arm Pain    Past Medical History:  Diagnosis Date   Anxiety    Arthritis    Cervicalgia    Chronic pain    Gallstones    HLD (hyperlipidemia)    HTN (hypertension)    IBS (irritable bowel syndrome)    Myalgia    Osteoporosis    Pneumonia     Patient Active Problem List   Diagnosis Date Noted   Aortic atherosclerosis (HCC) 09/25/2022   Coronary artery calcification 09/25/2022   Myalgia due to statin 09/25/2022   Chest pain of uncertain etiology 02/10/2022   Essential hypertension 02/10/2022   Tobacco abuse 02/10/2022   IBS (irritable bowel syndrome) 04/11/2013   Chronic narcotic dependence (HCC) 04/11/2013   Chronic back pain 04/11/2013   S/P cholecystectomy 04/11/2013   Hx of adenomatous colonic polyps 04/11/2013   Mixed hyperlipidemia 04/11/2013    Past Surgical History:  Procedure Laterality Date   BLADDER SURGERY     CESAREAN SECTION     CHOLECYSTECTOMY     VAGINAL HYSTERECTOMY      OB History   No obstetric history on file.      Home Medications    Prior to Admission medications   Medication Sig Start Date End Date Taking? Authorizing Provider  amitriptyline (ELAVIL) 25 MG tablet Take 25 mg by mouth at bedtime.   Yes [provider]  aspirin 81 MG tablet Take 81 mg by mouth daily.   Yes [provider]  atorvastatin (LIPITOR) 20 MG tablet Take 20 mg by mouth daily.   Yes [provider]  citalopram (CELEXA) 20 MG tablet daily. 07/13/15  Yes [provider]  ezetimibe (ZETIA) 10 MG tablet TAKE 1 TABLET BY MOUTH EVERY DAY 02/23/23  Yes Chandrasekhar, Mahesh A, MD  loperamide (IMODIUM A-D) 2 MG tablet Take 2 mg by mouth as needed for diarrhea or loose stools (IBS).   Yes [provider]  metoprolol succinate (TOPROL-XL) 25 MG 24 hr tablet Take 12.5 mg by mouth daily.   Yes [provider]  tiZANidine (ZANAFLEX) 4 MG tablet Take by mouth in the morning and at bedtime. 10/18/15  Yes [provider]  Calcium Carbonate-Vitamin D 600-400 MG-UNIT chew tablet Chew 1 tablet by mouth 3 (three) times daily with meals.    [provider]  ibuprofen (ADVIL) 200 MG tablet Take 400 mg by mouth every 6 (six) hours as needed for headache, mild pain or moderate pain.    [provider]  nitroGLYCERIN (NITROSTAT) 0.4 MG SL tablet Place 1 tablet (0.4 mg total) under the tongue every 5 (five) minutes as needed for chest pain (can take up to 3 tablets). 02/26/22   Christell Constant, MD    Family History Family History  Problem Relation Age of Onset   Heart attack Mother    Heart attack Father    Heart disease Maternal Grandmother    Colon polyps Paternal  Grandmother    Heart disease Paternal Grandmother    Heart disease Paternal Grandfather    Breast cancer Paternal Aunt     Social History Social History   Tobacco Use   Smoking status: Every Day    Current packs/day: 1.00    Average packs/day: 1 pack/day for 48.0 years (48.0 ttl pk-yrs)    Types: Cigarettes   Smokeless tobacco: Never   Tobacco comments:    e-sig  Substance Use Topics   Alcohol use: No   Drug use: No     Allergies   Fentanyl, Iodine, Pravastatin, and Sulfa antibiotics   Review of Systems Review of Systems Per HPI  Physical Exam Triage Vital Signs ED Triage Vitals  Encounter Vitals Group     BP 09/20/23 1309 (!) 125/58     Systolic BP Percentile --      Diastolic BP Percentile --      Pulse Rate 09/20/23 1309  77     Resp 09/20/23 1309 18     Temp 09/20/23 1309 98.4 F (36.9 C)     Temp Source 09/20/23 1309 Oral     SpO2 09/20/23 1309 94 %     Weight --      Height --      Head Circumference --      Peak Flow --      Pain Score 09/20/23 1306 6     Pain Loc --      Pain Education --      Exclude from Growth Chart --    No data found.  Updated Vital Signs BP (!) 125/58 (BP Location: Left Arm)   Pulse 77   Temp 98.4 F (36.9 C) (Oral)   Resp 18   SpO2 94%   Visual Acuity Right Eye Distance:   Left Eye Distance:   Bilateral Distance:    Right Eye Near:   Left Eye Near:    Bilateral Near:     Physical Exam Constitutional:      General: She is not in acute distress.    Appearance: Normal appearance. She is not toxic-appearing or diaphoretic.  HENT:     Head: Normocephalic and atraumatic.  Eyes:     Extraocular Movements: Extraocular movements intact.     Conjunctiva/sclera: Conjunctivae normal.  Pulmonary:     Effort: Pulmonary effort is normal.  Musculoskeletal:     Comments: Patient has tenderness to palpation to proximal forearm that extends into the lateral epicondyle area of the right elbow.  No direct tenderness overlying the olecranon.  No swelling or discoloration noted.  Flexion and extension of the wrist elicits pain.  Grip strength is 5/5.  Neurovascularly intact.  No abrasions or lacerations noted.  Neurological:     General: No focal deficit present.     Mental Status: She is alert and oriented to person, place, and time. Mental status is at baseline.  Psychiatric:        Mood and Affect: Mood normal.        Behavior: Behavior normal.        Thought Content: Thought content normal.        Judgment: Judgment normal.      UC Treatments / Results  Labs (all labs ordered are listed, but only abnormal results are displayed) Labs Reviewed - No data to display  EKG   Radiology No results found.  Procedures Procedures (including critical care  time)  Medications Ordered in UC Medications - No data to display  Initial Impression / Assessment and Plan / UC Course  I have reviewed the triage vital signs and the nursing notes.  Pertinent labs & imaging results that were available during my care of the patient were reviewed by me and considered in my medical decision making (see chart for details).     Physical exam is consistent with lateral epicondylitis. Given no injury, imaging was deferred.  Tennis elbow brace applied by clinical staff prior to discharge.  Advised supportive care including safe over-the-counter medications to alleviate symptoms and appropriate administration.  Advised patient that NSAIDs can be beneficial but to use sparingly given the daily medications she takes. Advised to follow-up with PCP or orthopedist if pain persists or worsens.  Patient reports that she has an appointment with PCP in approximately 10 days.  Patient verbalized understanding and was agreeable with plan. Final Clinical Impressions(s) / UC Diagnoses   Final diagnoses:  Lateral epicondylitis of right elbow     Discharge Instructions      It appears that you have tennis elbow.  A brace has been applied.  Take this off while sleeping.  Follow-up with orthopedist if pain persist or worsens.     ED Prescriptions   None    PDMP not reviewed this encounter.   Gustavus Bryant, [place] 09/20/23 1356    Gustavus Bryant, [place] 09/20/23 1357

## 2023-09-30 DIAGNOSIS — J439 Emphysema, unspecified: Secondary | ICD-10-CM | POA: Diagnosis not present

## 2023-09-30 DIAGNOSIS — M5000 Cervical disc disorder with myelopathy, unspecified cervical region: Secondary | ICD-10-CM | POA: Diagnosis not present

## 2023-09-30 DIAGNOSIS — Z9181 History of falling: Secondary | ICD-10-CM | POA: Diagnosis not present

## 2023-09-30 DIAGNOSIS — Z1331 Encounter for screening for depression: Secondary | ICD-10-CM | POA: Diagnosis not present

## 2023-09-30 DIAGNOSIS — M7711 Lateral epicondylitis, right elbow: Secondary | ICD-10-CM | POA: Diagnosis not present

## 2023-09-30 DIAGNOSIS — Z139 Encounter for screening, unspecified: Secondary | ICD-10-CM | POA: Diagnosis not present

## 2023-09-30 DIAGNOSIS — I10 Essential (primary) hypertension: Secondary | ICD-10-CM | POA: Diagnosis not present

## 2023-09-30 DIAGNOSIS — E7800 Pure hypercholesterolemia, unspecified: Secondary | ICD-10-CM | POA: Diagnosis not present

## 2023-09-30 DIAGNOSIS — I2510 Atherosclerotic heart disease of native coronary artery without angina pectoris: Secondary | ICD-10-CM | POA: Diagnosis not present

## 2023-09-30 DIAGNOSIS — I700 Atherosclerosis of aorta: Secondary | ICD-10-CM | POA: Diagnosis not present

## 2023-09-30 DIAGNOSIS — M810 Age-related osteoporosis without current pathological fracture: Secondary | ICD-10-CM | POA: Diagnosis not present

## 2023-09-30 DIAGNOSIS — F418 Other specified anxiety disorders: Secondary | ICD-10-CM | POA: Diagnosis not present

## 2023-10-01 LAB — LAB REPORT - SCANNED: EGFR: 61

## 2023-10-02 ENCOUNTER — Telehealth: Payer: Self-pay

## 2023-10-02 DIAGNOSIS — E782 Mixed hyperlipidemia: Secondary | ICD-10-CM

## 2023-10-02 NOTE — Telephone Encounter (Signed)
Spoke with pt about LDL increasing from previous visit and Dr. Izora Ribas wanting to have another fasting LDL and ALT drawn in 3 months. Order has been placed. Pt verbalized understanding and had not further questions at this time.

## 2023-10-02 NOTE — Telephone Encounter (Signed)
-----   Message from Christell Constant sent at 10/02/2023  1:21 PM EDT ----- Results: LDL as increased from prior Plan: Recheck in 3 months (fasting lipids and ALT) and may offer increase in medication dosing  Christell Constant, MD

## 2023-10-14 DIAGNOSIS — M791 Myalgia, unspecified site: Secondary | ICD-10-CM | POA: Diagnosis not present

## 2023-11-10 DIAGNOSIS — Z9181 History of falling: Secondary | ICD-10-CM | POA: Diagnosis not present

## 2023-11-10 DIAGNOSIS — Z1331 Encounter for screening for depression: Secondary | ICD-10-CM | POA: Diagnosis not present

## 2023-11-10 DIAGNOSIS — Z0000 Encounter for general adult medical examination without abnormal findings: Secondary | ICD-10-CM | POA: Diagnosis not present

## 2023-11-10 DIAGNOSIS — Z139 Encounter for screening, unspecified: Secondary | ICD-10-CM | POA: Diagnosis not present

## 2023-11-26 ENCOUNTER — Encounter: Payer: Self-pay | Admitting: Internal Medicine

## 2023-11-26 ENCOUNTER — Ambulatory Visit: Payer: Medicare Other | Attending: Internal Medicine | Admitting: Internal Medicine

## 2023-11-26 ENCOUNTER — Telehealth: Payer: Self-pay

## 2023-11-26 VITALS — BP 130/66 | HR 79 | Resp 16 | Ht 62.0 in | Wt 130.0 lb

## 2023-11-26 DIAGNOSIS — I2510 Atherosclerotic heart disease of native coronary artery without angina pectoris: Secondary | ICD-10-CM | POA: Diagnosis not present

## 2023-11-26 DIAGNOSIS — M791 Myalgia, unspecified site: Secondary | ICD-10-CM | POA: Diagnosis not present

## 2023-11-26 DIAGNOSIS — I700 Atherosclerosis of aorta: Secondary | ICD-10-CM | POA: Diagnosis not present

## 2023-11-26 DIAGNOSIS — T466X5A Adverse effect of antihyperlipidemic and antiarteriosclerotic drugs, initial encounter: Secondary | ICD-10-CM | POA: Diagnosis not present

## 2023-11-26 DIAGNOSIS — E782 Mixed hyperlipidemia: Secondary | ICD-10-CM | POA: Diagnosis not present

## 2023-11-26 DIAGNOSIS — I10 Essential (primary) hypertension: Secondary | ICD-10-CM | POA: Insufficient documentation

## 2023-11-26 DIAGNOSIS — T466X5D Adverse effect of antihyperlipidemic and antiarteriosclerotic drugs, subsequent encounter: Secondary | ICD-10-CM | POA: Diagnosis not present

## 2023-11-26 DIAGNOSIS — Z0000 Encounter for general adult medical examination without abnormal findings: Secondary | ICD-10-CM

## 2023-11-26 DIAGNOSIS — Z720 Tobacco use: Secondary | ICD-10-CM | POA: Diagnosis not present

## 2023-11-26 MED ORDER — NITROGLYCERIN 0.4 MG SL SUBL: 0.4000 mg | SUBLINGUAL_TABLET | SUBLINGUAL | 3 refills | Status: DC | PRN

## 2023-11-26 NOTE — Progress Notes (Signed)
Cardiology Office Note:    Date:  11/26/2023   ID:  Heather Stevens, DOB [birth year], MRN [id number]  PCP:  Lonie Peak, PA-C   [facility] Providers Cardiologist:  Christell Constant, MD     Referring MD: Lonie Peak, PA-C   CC: F/u CAC  History of Present Illness:    Heather Stevens is a 72 y.o. female with a hx of HTN, HLD, Current tobacco abuse, LAD and RCA CAC and aortic atherosclerosis. In interim had negative stress test. 2023: LDL increased we discussed more aggressive cholesterol strategy and smoking cessation.  Discussed the use of AI scribe software for clinical note transcription with the patient, who gave verbal consent to proceed.  Heather Stevens, a 72 year old patient with a history of hypertension, hyperlipidemia, and tobacco abuse, presents for a follow-up visit. The patient has known coronary artery calcifications in the LAD and RCA, as well as aortic atherosclerosis. In 2023, a negative stress test was reported. Despite an increase in LDL levels, the patient has made significant strides in smoking cessation, reducing from two packs a day to four or five cigarettes daily over the past two years.  The patient also has hyperlipidemia complicated by statin myalagia on 40mg  of Lipitor. The patient is also on aspirin 81mg  for coronary artery calcifications.  In 2023, the patient was started on citalopram for depression, with no significant QTc prolongation noted. The patient's cholesterol was slightly elevated in October, despite current therapy. The patient also reports occasional use of nitroglycerin for chest discomfort, with the most recent use several months ago (once in one year).  The patient also reports some physical limitations due to shoulder and spine issues, which may impact her ability to exercise.   Past Medical History:  Diagnosis Date   Anxiety    Arthritis    Cervicalgia    Chronic pain    Gallstones    HLD (hyperlipidemia)    HTN  (hypertension)    IBS (irritable bowel syndrome)    Myalgia    Osteoporosis    Pneumonia     Past Surgical History:  Procedure Laterality Date   BLADDER SURGERY     CESAREAN SECTION     CHOLECYSTECTOMY     VAGINAL HYSTERECTOMY      Current Medications: Current Meds  Medication Sig   amitriptyline (ELAVIL) 25 MG tablet Take 25 mg by mouth at bedtime.   aspirin 81 MG tablet Take 81 mg by mouth daily.   atorvastatin (LIPITOR) 20 MG tablet Take 20 mg by mouth daily.   Calcium Carbonate-Vitamin D 600-400 MG-UNIT chew tablet Chew 1 tablet by mouth 3 (three) times daily with meals.   citalopram (CELEXA) 20 MG tablet daily.   ezetimibe (ZETIA) 10 MG tablet TAKE 1 TABLET BY MOUTH EVERY DAY   ibuprofen (ADVIL) 200 MG tablet Take 400 mg by mouth every 6 (six) hours as needed for headache, mild pain or moderate pain.   loperamide (IMODIUM A-D) 2 MG tablet Take 2 mg by mouth as needed for diarrhea or loose stools (IBS).   metoprolol succinate (TOPROL-XL) 25 MG 24 hr tablet Take 12.5 mg by mouth daily.   tiZANidine (ZANAFLEX) 4 MG tablet Take by mouth in the morning and at bedtime.   [DISCONTINUED] nitroGLYCERIN (NITROSTAT) 0.4 MG SL tablet Place 1 tablet (0.4 mg total) under the tongue every 5 (five) minutes as needed for chest pain (can take up to 3 tablets).     Allergies:   Fentanyl, Iodine, Pravastatin,  and Sulfa antibiotics   Social History   Socioeconomic History   Marital status: Widowed    Spouse name: Not on file   Number of children: 2   Years of education: Not on file   Highest education level: Not on file  Occupational History   Occupation: disabled  Tobacco Use   Smoking status: Every Day    Current packs/day: 1.00    Average packs/day: 1 pack/day for 48.0 years (48.0 ttl pk-yrs)    Types: Cigarettes   Smokeless tobacco: Never   Tobacco comments:    e-sig  Substance and Sexual Activity   Alcohol use: No   Drug use: No   Sexual activity: Not on file  Other Topics  Concern   Not on file  Social History Narrative   Not on file   Social Drivers of Health   Financial Resource Strain: Not on file  Food Insecurity: Not on file  Transportation Needs: Not on file  Physical Activity: Not on file  Stress: Not on file  Social Connections: Not on file    Social: Widow for the past 9 years  Family History: The patient's family history includes Breast cancer in her paternal aunt; Colon polyps in her paternal grandmother; Heart attack in her father and mother; Heart disease in her maternal grandmother, paternal grandfather, and paternal grandmother.  ROS:   Please see the history of present illness.     EKGs/Labs/Other Studies Reviewed:    The following studies were reviewed today:  EKG 02/10/22: SR rate 74 RAE Qtc 448 Cardiac Studies & Procedures     STRESS TESTS  MYOCARDIAL PERFUSION IMAGING 02/20/2022  Narrative   Findings are consistent with no prior ischemia. The study is low risk.   No ST deviation was noted.   LV perfusion is equivocal.   Left ventricular function is normal. Nuclear stress EF: 80 %. The left ventricular ejection fraction is hyperdynamic (>65%). End diastolic cavity size is normal. End systolic cavity size is normal.   Prior study not available for comparison.  Mild fixed mid-apical perfusion defect with hyperdynamic motion, most consistent with artifact (suspect breast attenuation). Low risk study without evidence of ischemia.              Recent Labs: No results found for requested labs within last 365 days.  Recent Lipid Panel No results found for: "CHOL", "TRIG", "HDL", "CHOLHDL", "VLDL", "LDLCALC", "LDLDIRECT"      Physical Exam:    VS:  BP 130/66 (BP Location: Left Arm, Patient Position: Sitting, Cuff Size: Normal)   Pulse 79   Resp 16   Ht 5\' 2"  (1.575 m)   Wt 130 lb (59 kg)   SpO2 94%   BMI 23.78 kg/m     Wt Readings from Last 3 Encounters:  11/26/23 130 lb (59 kg)  09/25/22 123 lb 12.8 oz (56.2 kg)   02/20/22 119 lb (54 kg)    Gen: no distress, smells ok smoke Neck: No JVD Cardiac: No Rubs or Gallops, no murmur, RRR +2 radial pulses Respiratory: Rare right sided expiratory wheeze, normal effort, normal  respiratory rate GI: Soft, nontender, non-distended  MS: No  edema;  moves all extremities Integument: Skin feels warm Neuro:  At time of evaluation, alert and oriented to person/place/time/situation  Psych: Normal affect, patient feels ok  ASSESSMENT:    1. Aortic atherosclerosis (HCC)   2. Mixed hyperlipidemia   3. Tobacco abuse      PLAN:    Nonobstructive Coronary  Artery Disease Nonobstructive coronary artery disease with LAD and RCA calcifications and aortic atherosclerosis. Negative stress test in 2022. One-time nitroglycerin use in the past year for chest discomfort, controlled with aspirin 81 mg daily. Discussed risks and benefits of current management and alternative testing due to contrast allergy (PET MPI if new CP and not unstable angina/NSTEMI) - Refill nitroglycerin - Continue aspirin 81 mg daily - Order labs in March 2025 - Refer to wellness coach for smoking cessation  Hyperlipidemia - Hyperlipidemia with aortic atherosclerosis and coronary artery calcifications. Statin myalgia on 40 mg Lipitor, tolerating 20 mg. Current cholesterol levels slightly elevated. Goal LDL < 70. Discussed potential future medications (PCSK9 inhibitors, bempedoic acid, inclisiran) and need for insurance approval. - Continue Lipitor 20 mg - Continue Zetia - Consider high sensitivity CRP testing for potential colchicine - Reassess cholesterol levels in March 2025 - discussed diet, cardio, and weight lifting strategies  Hypertension Hypertension well-controlled with metoprolol.  Tobacco Use Disorder Significant reduction in smoking from two packs per day to four or five cigarettes per day. Discussed further smoking cessation strategies and benefits of complete cessation. - Refer  to wellness coach for smoking cessation support  Depression Depression managed with citalopram. Slight QTc prolongation noted but not significant. Discussed risks and benefits of continuing citalopram.  Follow-up - Schedule follow-up appointment in one year unless symptoms change.  Offered PRN      Medication Adjustments/Labs and Tests Ordered: Current medicines are reviewed at length with the patient today.  Concerns regarding medicines are outlined above.  Orders Placed This Encounter  Procedures   Referral to HRT/VAS Care Navigation   EKG 12-Lead   Meds ordered this encounter  Medications   nitroGLYCERIN (NITROSTAT) 0.4 MG SL tablet    Sig: Place 1 tablet (0.4 mg total) under the tongue every 5 (five) minutes as needed for chest pain (can take up to 3 tablets).    Dispense:  25 tablet    Refill:  3    Patient Instructions  Medication Instructions:  Your physician recommends that you continue on your current medications as directed. Please refer to the Current Medication list given to you today.  *If you need a refill on your cardiac medications before your next appointment, please call your pharmacy*   Lab Work: [facility] 2025: Fasting lab work: FLP, ALT   If you have labs (blood work) drawn today and your tests are completely normal, you will receive your results only by: MyChart Message (if you have MyChart) OR A paper copy in the mail If you have any lab test that is abnormal or we need to change your treatment, we will call you to review the results.   Testing/Procedures: Your physician has referred you to see a Wellness Coach for Smoking Cessation.    Follow-Up: At [facility], you and your health needs are our priority.  As part of our continuing mission to provide you with exceptional heart care, we have created designated Provider Care Teams.  These Care Teams include your primary Cardiologist (physician) and Advanced Practice Providers (APPs -   Physician Assistants and Nurse Practitioners) who all work together to provide you with the care you need, when you need it.    Your next appointment:   1 year(s)  Provider:   Christell Constant, MD            Signed, Christell Constant, MD  11/26/2023 9:06 AM    [facility]

## 2023-11-26 NOTE — Patient Instructions (Signed)
Medication Instructions:  Your physician recommends that you continue on your current medications as directed. Please refer to the Current Medication list given to you today.  *If you need a refill on your cardiac medications before your next appointment, please call your pharmacy*   Lab Work: [facility] 2025: Fasting lab work: FLP, ALT   If you have labs (blood work) drawn today and your tests are completely normal, you will receive your results only by: MyChart Message (if you have MyChart) OR A paper copy in the mail If you have any lab test that is abnormal or we need to change your treatment, we will call you to review the results.   Testing/Procedures: Your physician has referred you to see a Wellness Coach for Smoking Cessation.    Follow-Up: At [facility], you and your health needs are our priority.  As part of our continuing mission to provide you with exceptional heart care, we have created designated Provider Care Teams.  These Care Teams include your primary Cardiologist (physician) and Advanced Practice Providers (APPs -  Physician Assistants and Nurse Practitioners) who all work together to provide you with the care you need, when you need it.    Your next appointment:   1 year(s)  Provider:   Christell Constant, MD

## 2023-11-26 NOTE — Telephone Encounter (Signed)
Called patient per health coaching referral for smoking cessation. Patient expressed interest in participating in health coaching and requested to have telephonic appointments. Patient has been scheduled for her initial session over the phone on 12/01/23 at 1:00pm.    Renaee Munda, MS, ERHD, [facility]  Care Guide, Health & Wellness Coach [address] [place] [address] Telephone: [phone] Email: [email or web address]

## 2023-12-01 ENCOUNTER — Ambulatory Visit: Payer: Medicare Other | Attending: Cardiovascular Disease

## 2023-12-01 DIAGNOSIS — Z0000 Encounter for general adult medical examination without abnormal findings: Secondary | ICD-10-CM

## 2023-12-03 NOTE — Progress Notes (Addendum)
 HEALTH & WELLNESS COACHING INITIAL INTAKE   Appointment Outcome: Completed, Session #: Initial Start time: 1:01pm   End time: 1:50pm   Total Mins: 49 minutes    What are the Patient's goals from Coaching? Quit Smoking   Why did they seek coaching now? Patient has cut back on smoking but finds it challenging to quit smoking completely due to her triggers to smoke.    Readiness - What stage is the patient in regarding their goal(s)? Patient is in the action stage of smoking cessation.    Coaching Progress Notes: Patient stated that she has reduced her smoking from one pack of cigarettes per day to 5-10 cigarettes. Patient mentioned that the number of cigarettes that she smokes depends on her stress level. Patient stated that after having a change in dosage of one of her medications, she has noticed that her stress level is a little higher.  Patient shared that being alone, talking on the phone, and eating/drinking are additional triggers for smoking. Patient reported that she was walking with her day to distract herself from smoking but has not been able to do so as much with the change in time and weather. Patient stated that she has been thinking of ways that she can refrain from picking up a cigarette and lighting it.  Patient shared that she finds herself lighting a cigarette and only taking a few puffs before putting it out. Patient stated that she is concerned about gaining weight when she quit smoking. Patient mentioned that she is also concerned about improving her cholesterol. Patient expressed interest in drinking more water and how she purchased a pack of water recently.  Patient reported that she has only smoked 3 cigarettes today since 5am. Patient stated that she has been walking on her treadmill for 5 minutes when she has an urge to smoke. Patient mentioned that she has tried Chantix, and it made her sick, along with having an allergic reaction to the glue on the nicotine  patches. Patient shared that she tolerates the nicotine lozenges well and they help to reduce the urge to smoke.    Coaching Outcomes Discussed with patient how drinking water may be helpful in reducing her urge to smoke as a recommended strategy from the Quit4Life workbook that she was previously mailed. Patient stated that she thinks that would help her increase her water intake and would distract her from thinking about smoking at that time.  Discussed with patient her concerned gaining weight and what she believe would be helpful in reducing the odds of that occurring. Patient stated that walking on the treadmill would not only help her refrain from smoking, manage her stress, and help her lose weight. Patient talked about changing her eating habits as well due to her cholesterol concern and would like to work on snack options.    AGREEMENTS SECTION   Overall Goal(s): Quit smoking - Quit Date (TBD)                                            Agreement/Action Steps:  Take out allotment of 3-5 cigarettes daily and put remaining pack out of sight Practice mini quits Use a lozenge when urge to smoke occurs Walk on treadmill for 5 minutes to deter smoking Eat a healthy snack/drink water to deter smoking   Patient review of Health Coaching Agreement Reviewed Coaching Agreement and  Code of Ethics with Patient during initial session. Answered any questions the patient had if any regarding the Coaching Agreement and Code of Ethics. Patient verbally agreed to adhere to the Coaching Agreement and to abide by the Code of Ethics.  Mailed patient with a hard/electronic copy of the Coaching Agreement and Code of Ethics.    Resources: Patient was mailed information on stress management and healthy snack ideas. Patient was emailed an outline of her action steps.

## 2023-12-14 ENCOUNTER — Telehealth: Payer: Self-pay

## 2023-12-14 ENCOUNTER — Ambulatory Visit: Payer: Medicare Other

## 2023-12-14 DIAGNOSIS — Z0000 Encounter for general adult medical examination without abnormal findings: Secondary | ICD-10-CM

## 2023-12-14 NOTE — Telephone Encounter (Signed)
 Patient called in and stated that she had an emergency come up and needed to reschedule her health coaching session. Patient stated that 1/17 at 1:00pm would work best for her. Patient has been rescheduled as requested and will be called at that time.   Greig Ruth, MS, ERHD, [facility]  Care Guide, Health & Wellness Coach [address], [address] [place] [address] Telephone: [phone] Email: [email or web address]

## 2023-12-18 ENCOUNTER — Ambulatory Visit: Payer: Medicare Other

## 2023-12-18 DIAGNOSIS — Z0000 Encounter for general adult medical examination without abnormal findings: Secondary | ICD-10-CM

## 2023-12-18 NOTE — Progress Notes (Signed)
Appointment Outcome: Completed, Session #: 1                        Start time: 1:00pm   End time: 1:27pm   Total Mins: 27 minutes  AGREEMENTS SECTION   Overall Goal(s): Quit smoking - Quit Date (TBD)                                              Agreement/Action Steps:  Take out allotment of 3-5 cigarettes daily and put remaining pack out of sight Practice mini quits Use a lozenge when urge to smoke occurs Walk on treadmill for 5 minutes to deter smoking Eat a healthy snack/drink water to deter smoking    Progress Notes:  Patient reported that she has been able to cut down to smoking 5 cigarettes per day consistently. Patient stated that she only had one day that she smoked 8 cigarettes because she experienced a stressful situation that she was not used to when caring for a friend with dementia. Patient shared that she carried her materials with her to keep herself accountable with her steps for reducing her smoking and looking at the stress management handout to identify ways that she could reduce her stress without smoking to cope.   Patient stated that she has increased her water intake to drinking 3-4 (16.9 oz) water bottles per day, utilizes a support system with her friends that reminds her of her steps, times herself on the treadmill 5 minutes at a time approximately 5-6 times daily. Patient reported getting tired after 3-4 minutes because she was walking fast on an incline, but it helped deter her from smoking.   Patient reported that she was able to make it half a day on Sunday without smoking but gave into her urge and purchased two packs of cigarettes. Patient stated that she currently has 1/2 a pack left from those packs. Patient stated that when she finds herself having to go to other's residences, she will not bring the cigarettes with her by leaving them in the car to avoid increasing her smoking if she does get stressed.   Patient stated that from the stress management  handout, she found that practicing deep breathing and creating a to-do-list to move more during the day has been helpful. Patient shared an experience of clearing her steppingstones in her driveway due to the weather, so that she could get out to walk her dog.  Patient expressed that she is not where she has planned on being with decreasing her smoking and is aiming to reducing her smoking more. Patient felt that she didn't do as well until she reflected on all things that she had incorporated to reduce her smoking including using lozenges.   Indicators of Success and Accountability:  Patient reached and maintained smoking 5 cigarettes per day.  Readiness: Patient is in the action stage of smoking cessation.   Strengths and Supports: Patient is being supported by friends and family. Patient is relying on tracking her progress and staying physically active.  Challenges and Barriers: Patient experienced high levels of stress that contributed to her smoking.   Coaching Outcomes: Patient stated that she wants to challenge herself to work to cut down to smoking no more than 3 cigarettes over the next two weeks.   Patient will implement her action steps outlined below  to aid in smoking cessation and managing stress.    Overall Goal(s): Quit smoking - Quit Date (TBD) Stress management                                              Agreement/Action Steps: Smoking cessation Take out allotment of 3 cigarettes daily and put remaining pack out of sight Practice mini quits Use a lozenge when urge to smoke occurs Walk on treadmill for 5 minutes to deter smoking Eat a healthy snack (e.g., walnuts, dried cranberries, apples, oranges, bananas Drink 3-4 (16.9 oz.) water bottles per day to deter smoking Utilize support system to work through urges to smoke   Stress management  Practice deep breathing Create a to-do-list to aid in moving around more daily    Attempted: Fulfilled - Patient was  able to practice mini quits by using a lozenge, walking on the treadmill for 5 minutes at a time, and eating healthy snacks/drinking water to deter smoking.   Partial - Patient was able to stick to the allotment of cigarettes daily for 13/14 days.

## 2023-12-31 ENCOUNTER — Ambulatory Visit
Admission: RE | Admit: 2023-12-31 | Discharge: 2023-12-31 | Disposition: A | Discharge status: Home / Self Care | Payer: Medicare Other | Source: Ambulatory Visit | Attending: Family Medicine | Admitting: Family Medicine

## 2023-12-31 ENCOUNTER — Ambulatory Visit (INDEPENDENT_AMBULATORY_CARE_PROVIDER_SITE_OTHER): Payer: Medicare Other

## 2023-12-31 VITALS — BP 119/76 | HR 98 | Temp 97.4°F | Resp 16

## 2023-12-31 DIAGNOSIS — R051 Acute cough: Secondary | ICD-10-CM

## 2023-12-31 DIAGNOSIS — R062 Wheezing: Secondary | ICD-10-CM | POA: Diagnosis not present

## 2023-12-31 DIAGNOSIS — F17210 Nicotine dependence, cigarettes, uncomplicated: Secondary | ICD-10-CM | POA: Diagnosis not present

## 2023-12-31 DIAGNOSIS — R059 Cough, unspecified: Secondary | ICD-10-CM | POA: Diagnosis not present

## 2023-12-31 MED ORDER — PREDNISONE 20 MG PO TABS: 40.0000 mg | ORAL_TABLET | Freq: Every day | ORAL | 0 refills | Status: DC

## 2023-12-31 MED ORDER — BENZONATATE 100 MG PO CAPS: ORAL_CAPSULE | ORAL | 0 refills | Status: DC

## 2023-12-31 MED ORDER — IPRATROPIUM-ALBUTEROL 0.5-2.5 (3) MG/3ML IN SOLN
3.0000 mL | Freq: Once | RESPIRATORY_TRACT | Status: AC
  Administered 2023-12-31: 3 mL via RESPIRATORY_TRACT

## 2023-12-31 NOTE — ED Triage Notes (Signed)
Pt presents with c/o cough x 4 days. Pt states she was sneezing a lot and then developed the cough. Pt has woken up with cold sweat. C/o diarrhea

## 2023-12-31 NOTE — ED Provider Notes (Signed)
[facility]   [id number] 12/31/23 Arrival Time: 1146  ASSESSMENT & PLAN:  1. Acute cough   2. Wheezing   3. Cigarette smoker    Reports feeling better after duoneb x 1. Wheezing much less on exam. SpO2 93%. I have personally viewed and independently interpreted the imaging studies ordered this visit. CXR: no acute changes.  Likely viral illness as trigger. Discussed. OTC symptom care as needed.  Meds ordered this encounter  Medications   ipratropium-albuterol (DUONEB) 0.5-2.5 (3) MG/3ML nebulizer solution 3 mL   predniSONE (DELTASONE) 20 MG tablet    Sig: Take 2 tablets (40 mg total) by mouth daily.    Dispense:  10 tablet    Refill:  0   benzonatate (TESSALON) 100 MG capsule    Sig: Take 1 capsule by mouth every 8 (eight) hours for cough.    Dispense:  21 capsule    Refill:  0     Follow-up Information     Schedule an appointment as soon as possible for a visit  with Lonie Peak, PA-C.   Specialty: Physician Assistant Why: For follow up. Contact information: [address] [place] [address] [phone]         [facility] Emergency Department at [facility].   Specialty: Emergency Medicine Why: If symptoms worsen in any way. Contact information: [address] [place] [address] [phone]                Reviewed expectations re: course of current medical issues. Questions answered. Outlined signs and symptoms indicating need for more acute intervention. Understanding verbalized. After Visit Summary given.   SUBJECTIVE: History from: Patient. Heather Stevens is a 73 y.o. female. Pt presents with c/o cough x 4 days. Pt states she was sneezing a lot and then developed the cough. Waking with "cold sweat" yesterday evening/early am today. Feels she is wheezing over past 12-24 hours.  Social History   Tobacco Use  Smoking Status Every Day   Current packs/day: 1.00   Average packs/day: 1 pack/day  for 48.0 years (48.0 ttl pk-yrs)   Types: Cigarettes  Smokeless Tobacco Never  Tobacco Comments   e-sig  Denies fever.  OBJECTIVE:  Vitals:   12/31/23 1206 12/31/23 1312 12/31/23 1325  BP: 119/76    Pulse: 98    Resp: 16    Temp: 98 F (36.7 C) (!) 97.4 F (36.3 C)   TempSrc: Oral Oral   SpO2: 90% 96% 93%    General appearance: alert; no distress Eyes: PERRLA; EOMI; conjunctiva normal HENT: [place]; AT; with nasal congestion Neck: supple  Lungs: speaks full sentences without difficulty; unlabored; bilateral exp wheezing Extremities: no edema Skin: warm and dry Neurologic: normal gait Psychological: alert and cooperative; normal mood and affect  Imaging: DG Chest 2 View Result Date: 12/31/2023 CLINICAL DATA:  Cough for 4 days. EXAM: CHEST - 2 VIEW COMPARISON:  None Available. FINDINGS: The heart size and mediastinal contours are within normal limits. Both lungs are clear. The visualized skeletal structures are unremarkable. IMPRESSION: No active cardiopulmonary disease. Electronically Signed   By: Lupita Raider M.D.   On: 12/31/2023 12:56    Allergies  Allergen Reactions   Fentanyl    Iodine    Pravastatin    Sulfa Antibiotics     Past Medical History:  Diagnosis Date   Anxiety    Arthritis    Cervicalgia    Chronic pain    Gallstones  HLD (hyperlipidemia)    HTN (hypertension)    IBS (irritable bowel syndrome)    Myalgia    Osteoporosis    Pneumonia    Social History   Socioeconomic History   Marital status: Widowed    Spouse name: Not on file   Number of children: 2   Years of education: Not on file   Highest education level: Not on file  Occupational History   Occupation: disabled  Tobacco Use   Smoking status: Every Day    Current packs/day: 1.00    Average packs/day: 1 pack/day for 48.0 years (48.0 ttl pk-yrs)    Types: Cigarettes   Smokeless tobacco: Never   Tobacco comments:    e-sig  Substance and Sexual Activity   Alcohol use: No    Drug use: No   Sexual activity: Not on file  Other Topics Concern   Not on file  Social History Narrative   Not on file   Social Drivers of Health   Financial Resource Strain: Not on file  Food Insecurity: Not on file  Transportation Needs: Not on file  Physical Activity: Not on file  Stress: Not on file  Social Connections: Not on file  Intimate Partner Violence: Not on file   Family History  Problem Relation Age of Onset   Heart attack Mother    Heart attack Father    Heart disease Maternal Grandmother    Colon polyps Paternal Grandmother    Heart disease Paternal Grandmother    Heart disease Paternal Grandfather    Breast cancer Paternal Aunt    Past Surgical History:  Procedure Laterality Date   BLADDER SURGERY     CESAREAN SECTION     CHOLECYSTECTOMY     VAGINAL HYSTERECTOMY       Mardella Layman, MD 12/31/23 1328

## 2024-01-01 ENCOUNTER — Ambulatory Visit: Payer: Medicare Other

## 2024-01-01 ENCOUNTER — Telehealth: Payer: Self-pay

## 2024-01-01 DIAGNOSIS — Z0000 Encounter for general adult medical examination without abnormal findings: Secondary | ICD-10-CM

## 2024-01-01 NOTE — Telephone Encounter (Signed)
Called patient as scheduled for health coaching. Patient requested to be rescheduled due to being sick. Patient has been rescheduled as requested and will be called on 01/05/24 at 2:00pm.   Renaee Munda, MS, ERHD, [facility]  Care Guide, Health & Wellness Coach [address], [address] [place] [address] Telephone: [phone] Email: [email or web address]

## 2024-01-05 ENCOUNTER — Ambulatory Visit: Payer: Medicare Other

## 2024-01-05 DIAGNOSIS — Z0000 Encounter for general adult medical examination without abnormal findings: Secondary | ICD-10-CM

## 2024-01-05 NOTE — Progress Notes (Signed)
 Appointment Outcome: Completed, Session #: 2                       Start time: 2:02pm   End time: 2:22pm   Total Mins: 20 minutes  AGREEMENTS SECTION   Overall Goal(s): Quit smoking - Quit Date (TBD) Stress management                                              Agreement/Action Steps: Smoking cessation Take out allotment of 3 cigarettes daily and put remaining pack out of sight Practice mini quits Use a lozenge when urge to smoke occurs Walk on treadmill for 5 minutes to deter smoking Eat a healthy snack (e.g., walnuts, dried cranberries, apples, oranges, bananas Drink 3-4 (16.9 oz.) water bottles per day to deter smoking Utilize support system to work through urges to smoke     Stress management  Practice deep breathing Create a to-do-list to aid in moving around more daily    Progress Notes:  Patient did not have a challenge with maintaining the allotment of 3 cigarettes per day during week one.   Patient reported during week two taking 5 cigarettes on a trip and smoking 4 cigarettes within 3 days while visiting family. Patient brought one cigarette back.  Patient did not smoke on 1/27 due to not feeling well and coughing related to being sick.  Patient reported not being able to walk on treadmill after becoming sick, but found the activity helpful during week one. Patient also remind active doing household chores to deter her from smoking.  Patient was able to drink coffee without having the urge to smoke.  Patient stated that eating healthy snacks such as oranges and bananas has helped her not to smoke.    Coaching Outcomes: Patient stated that she wants to attempt to continue to refrain from smoking over the next two weeks. Otherwise, she will implement her action plan as outlined above over the next two weeks if she relapses.    Attempted: Fulfilled - Patient completed the bi-weekly agreement in full and was able to meet the challenge.   Referrals: N/A

## 2024-01-08 DIAGNOSIS — J439 Emphysema, unspecified: Secondary | ICD-10-CM | POA: Diagnosis not present

## 2024-01-08 DIAGNOSIS — M791 Myalgia, unspecified site: Secondary | ICD-10-CM | POA: Diagnosis not present

## 2024-01-19 ENCOUNTER — Ambulatory Visit: Payer: Medicare Other

## 2024-01-19 DIAGNOSIS — Z0000 Encounter for general adult medical examination without abnormal findings: Secondary | ICD-10-CM

## 2024-01-19 NOTE — Telephone Encounter (Signed)
error 

## 2024-01-19 NOTE — Progress Notes (Signed)
Appointment Outcome: Completed, Session #: 3                         Start time: 2:02pm   End time: 2:33pm   Total Mins: 31 minutes  AGREEMENTS SECTION   Overall Goal(s): Quit smoking - Quit Date (TBD) Stress management                                              Agreement/Action Steps: Smoking cessation Take out allotment of 3 cigarettes daily and put remaining pack out of sight Practice mini quits Use a lozenge when urge to smoke occurs Walk on treadmill for 5 minutes to deter smoking Eat a healthy snack (e.g., walnuts, dried cranberries, apples, oranges, bananas Drink 3-4 (16.9 oz.) water bottles per day to deter smoking Utilize support system to work through urges to smoke    Stress management  Practice deep breathing Create a to-do-list to aid in moving around more daily    Progress Notes:  Patient reported that she smokes 4 cigarettes per day, but yesterday she only smoked 2 cigarettes because she spent a significant amount of time away from home at a friend's house. Patient mentioned that she leaves her cigarettes outside in her car's console, and she has to go outside each time she wants a cigarette. Patient stated that creating the inconvenience of having to go outside helps her avoid smoking more than she does. Patient mentioned that she has also started using the nicotine lozenges to refrain from lighting more cigarettes.  Patient stated that she has been eating more since she has reduced her smoking along with taking prednisone for 10 days. Patient shared that her eating habits changed slightly with the foods she craved, but has been able to maintain eating healthy snacks such as oranges, bananas, nuts, and berries. Patient mentioned that she has been practicing deep breathing by going outside on her deck, walking around the yard with her dog, and doing things in the house to refrain from smoking and eating as much. Patient reported that she has not been able to return to  using the treadmill after being sick due to being sick and getting tired easily.  Patient shared that stress has continued to contribute to her smoking habit. Patient shared that her son talked with her about a particular stressor to understand why she was causing it to cause her to become overwhelm and offered her additional support. Patient stated that over the past two weeks with recovering from being sick and having a tree fall in her driveway, she has reduced her stress from an 8 out of 10 to a 5 out of 10 (1=low, 5=moderate, 10=high). Patient recalled that she was able to go without smoking while she was sick due to having a cough and shared how she caved and bought cigarettes after the tree fell.   Indicators of Success and Accountability: Patient has reduced her smoking to 4 cigarettes per day, which is less than half than where she was when she started health coaching.  Readiness: Patient is in the action stage of smoking cessation and stress management.  Strengths and Supports: Patient is being supported by family and friends. Patient is relying on being strategic.   Challenges and Barriers: Patient continues to be triggered to smoke due to stress.    Coaching Outcomes:  Patient analyzed her progress with reducing her smoking since starting health coaching smoking an average of 10 cigarettes daily. Patient expressed that it is a major difference smoking only 4 cigarettes compared to 1/2-1 pack of cigarettes per day. Patient wants to continue to work towards reducing her smoking by 1 cigarette (3 cigarettes/day) over the next two weeks as outlined above. Patient will resume usage of the treadmill when she is fully recovered.    Attempted: Partial - Patient implemented her action steps to reduce her smoking as outlined above but was not able to maintain smoking only 3 cigarettes per day (at 4 cigarettes per day with a day only smoking 2 cigarettes), and did not take out the specified  allotment per day and leaving pack out of sight for the remainder of the day.

## 2024-02-02 ENCOUNTER — Telehealth (HOSPITAL_BASED_OUTPATIENT_CLINIC_OR_DEPARTMENT_OTHER): Payer: Self-pay

## 2024-02-02 ENCOUNTER — Ambulatory Visit (HOSPITAL_BASED_OUTPATIENT_CLINIC_OR_DEPARTMENT_OTHER): Payer: Medicare Other

## 2024-02-02 DIAGNOSIS — Z0000 Encounter for general adult medical examination without abnormal findings: Secondary | ICD-10-CM

## 2024-02-02 DIAGNOSIS — U071 COVID-19: Secondary | ICD-10-CM | POA: Diagnosis not present

## 2024-02-02 NOTE — Telephone Encounter (Signed)
 Returned patient's call to reschedule health coaching appointment due to having Covid and not feeling well. Patient has been rescheduled as requested for 3/11 at 2:00pm over the phone.   Renaee Munda, MS, ERHD, [facility]  Care Guide, Health & Wellness Coach [address] [place] [address] Telephone: [phone] Email: [email or web address]

## 2024-02-03 ENCOUNTER — Telehealth: Payer: Self-pay | Admitting: Internal Medicine

## 2024-02-03 NOTE — Telephone Encounter (Signed)
 Called pt reports was told not to take zetia and atorvastatin for 5 days while taking Plaxlovid.  Reports was due to have FLP drawn soon.  I advised pt to push back  day has blood work drawn to account for time off of medications.  Pt thanked me for call no further concerns at this time.

## 2024-02-03 NOTE — Telephone Encounter (Signed)
 Pt c/o medication issue:  1. Name of Medication:   ezetimibe (ZETIA) 10 MG tablet  atorvastatin (LIPITOR) 20 MG tablet   2. How are you currently taking this medication (dosage and times per day)?  Not taking at this time  3. Are you having a reaction (difficulty breathing--STAT)?   4. What is your medication issue?   Patient called to report she was diagnosed with COVID and is taking Paxlovid.  Patient noted her doctor took her off these medication for 5 days (started yesterday, 3/4).  Patient noted she had a virus the end of January and had not exercised for a couple of weeks and had been on Prednisone.

## 2024-02-09 ENCOUNTER — Ambulatory Visit

## 2024-02-09 DIAGNOSIS — Z0000 Encounter for general adult medical examination without abnormal findings: Secondary | ICD-10-CM

## 2024-02-09 NOTE — Progress Notes (Signed)
 Appointment Outcome: Completed, Session #: 4                         Start time: 2:00pm    End time: 2:32pm   Total Mins: 32 minutes  AGREEMENTS SECTION   Overall Goal(s): Quit smoking - Quit Date (TBD) Stress management                                              Agreement/Action Steps: Smoking cessation Take out allotment of 3 cigarettes daily and put remaining pack out of sight Practice mini quits Use a lozenge when urge to smoke occurs Walk on treadmill for 5 minutes to deter smoking Eat a healthy snack (e.g., walnuts, dried cranberries, apples, oranges, bananas Drink 3-4 (16.9 oz.) water bottles per day to deter smoking Utilize support system to work through urges to smoke     Stress management  Practice deep breathing Create a to-do-list to aid in moving around more daily    Progress Notes:  Smoking Cessation Patient reported that she did not smoke from 3/3 until after 10pm the night of 3/10. Patient stated that while having Covid and coughing she was not interested in smoking. Patient stated that she began to have an urge to smoke around 8pm on 3/10 and used a lozenge, but gave in around 10pm and went to the store to purchase a pack of cigarettes. Patient explained that she was not thinking about any of her additional steps but recognizes now that she could have used the treadmill. Patient resumed walking on the treadmill on 3/9, twice in 4-minute increments, and twice on 3/10 in 5-minute increments. Patient explained that she was tired after walking.  Patient stated that she has not smoked today and that the cigarettes are in the trunk of her car. Patient shared that she has used 2 lozenges today that are 4 mg each and walked the dog several times. Patient mentioned that she has going to the grocery store on yesterday and purchase healthy snacks and continue to eat bananas, oranges, and nuts. Patient stated that people brought her care packages that include 2 packages of  cookies, which she was able to give one away. Patient reported eating 1/2 a pack since last Thursday.   Patient stated that she is motivated to quit because she is thinking about what smoking is doing to her arteries, lungs and overall health. Patient shared that it made her feel good to get the report that her provider did not hear wheezing. Patient expressed that she feels that the cutting back on smoking has already begun to make a difference. Patient stated that prior to 3/3 she was smoking 3 cigarettes per day since our last health coaching session, and is hoping that she can continue with the progress that she has made today.  Stress management Did not discuss practicing deep breathing as a strategy to manage stress due to patient previously having Covid.  Patient utilized walking her dog as a stress management activity and a way to deter her from smoking over the course of today.    Coaching Outcomes: Patient has determined that she wants to set her quit date prior to ending the bi-weekly sessions. Patient will be aiming not to buy another pack of cigarettes over the next two weeks and will be smoke free by 02/23/24.  Patient will continue to implement all other action steps as outlined below over the next two weeks.     Overall Goal(s): Quit smoking - Quit Date (02/23/2024) Stress management                                              Agreement/Action Steps:  Smoking cessation Use a lozenge when urge to smoke occurs Walk on treadmill for 5 minutes to deter smoking Eat a healthy snack (e.g., walnuts, dried cranberries, apples, oranges, bananas Drink at least 3 (16.9 oz.) water bottles per day to deter smoking Utilize support system to work through urges to smoke     Stress management  Practice deep breathing Create a to-do-list to aid in moving around more daily   Attempted: Fulfilled - Patient completed the bi-weekly agreement in full and was able to meet the  challenge.  Referrals: N/A  Resources: N/A

## 2024-02-12 ENCOUNTER — Other Ambulatory Visit: Payer: Self-pay | Admitting: Internal Medicine

## 2024-02-23 ENCOUNTER — Ambulatory Visit: Payer: Self-pay

## 2024-02-23 ENCOUNTER — Telehealth: Payer: Self-pay | Admitting: Internal Medicine

## 2024-02-23 DIAGNOSIS — Z0000 Encounter for general adult medical examination without abnormal findings: Secondary | ICD-10-CM

## 2024-02-23 NOTE — Telephone Encounter (Signed)
 Patient stated she will not be able keep Nurse Visit today due to family emergency issue.  Patient apologized for late notice cancellation and wants a call back tomorrow to reschedule.

## 2024-02-24 NOTE — Telephone Encounter (Signed)
 Returned patient's call as requested today to reschedule telephonic health coaching appointment on 02/23/24 at 2pm that she cancelled.  Patient did not answer. Left message for patient to return call.   Renaee Munda, MS, ERHD, [facility]  Care Guide [facility] Care Navigation Telephone: [phone] Email: [email or web address]

## 2024-03-22 ENCOUNTER — Encounter: Payer: Self-pay | Admitting: Physician Assistant

## 2024-03-22 ENCOUNTER — Other Ambulatory Visit: Payer: Self-pay | Admitting: Physician Assistant

## 2024-03-22 DIAGNOSIS — Z1231 Encounter for screening mammogram for malignant neoplasm of breast: Secondary | ICD-10-CM

## 2024-03-31 ENCOUNTER — Ambulatory Visit
Admission: RE | Admit: 2024-03-31 | Discharge: 2024-03-31 | Disposition: A | Discharge status: Home / Self Care | Source: Ambulatory Visit | Attending: Physician Assistant | Admitting: Physician Assistant

## 2024-03-31 DIAGNOSIS — Z1231 Encounter for screening mammogram for malignant neoplasm of breast: Secondary | ICD-10-CM | POA: Diagnosis not present

## 2024-03-31 DIAGNOSIS — E782 Mixed hyperlipidemia: Secondary | ICD-10-CM | POA: Diagnosis not present

## 2024-04-01 LAB — LIPID PANEL
Chol/HDL Ratio: 3.1 ratio (ref 0.0–4.4)
Cholesterol, Total: 147 mg/dL (ref 100–199)
HDL: 48 mg/dL (ref >39)
LDL Chol Calc (NIH): 69 mg/dL (ref 0–99)
Triglycerides: 176 mg/dL — ABNORMAL HIGH (ref 0–149)
VLDL Cholesterol Cal: 30 mg/dL (ref 5–40)

## 2024-04-01 LAB — ALT: ALT: 21 IU/L (ref 0–32)

## 2024-04-04 DIAGNOSIS — I10 Essential (primary) hypertension: Secondary | ICD-10-CM | POA: Diagnosis not present

## 2024-04-04 DIAGNOSIS — I2510 Atherosclerotic heart disease of native coronary artery without angina pectoris: Secondary | ICD-10-CM | POA: Diagnosis not present

## 2024-04-04 DIAGNOSIS — E7800 Pure hypercholesterolemia, unspecified: Secondary | ICD-10-CM | POA: Diagnosis not present

## 2024-04-04 DIAGNOSIS — M810 Age-related osteoporosis without current pathological fracture: Secondary | ICD-10-CM | POA: Diagnosis not present

## 2024-04-04 DIAGNOSIS — J439 Emphysema, unspecified: Secondary | ICD-10-CM | POA: Diagnosis not present

## 2024-04-04 DIAGNOSIS — Z9181 History of falling: Secondary | ICD-10-CM | POA: Diagnosis not present

## 2024-04-04 DIAGNOSIS — F418 Other specified anxiety disorders: Secondary | ICD-10-CM | POA: Diagnosis not present

## 2024-04-04 DIAGNOSIS — M5000 Cervical disc disorder with myelopathy, unspecified cervical region: Secondary | ICD-10-CM | POA: Diagnosis not present

## 2024-04-27 DIAGNOSIS — F418 Other specified anxiety disorders: Secondary | ICD-10-CM | POA: Diagnosis not present

## 2024-04-27 DIAGNOSIS — M5000 Cervical disc disorder with myelopathy, unspecified cervical region: Secondary | ICD-10-CM | POA: Diagnosis not present

## 2024-04-27 DIAGNOSIS — I10 Essential (primary) hypertension: Secondary | ICD-10-CM | POA: Diagnosis not present

## 2024-04-27 DIAGNOSIS — M791 Myalgia, unspecified site: Secondary | ICD-10-CM | POA: Diagnosis not present

## 2024-05-18 ENCOUNTER — Ambulatory Visit
Admission: RE | Admit: 2024-05-18 | Discharge: 2024-05-18 | Disposition: A | Discharge status: Home / Self Care | Source: Ambulatory Visit | Attending: Acute Care | Admitting: Acute Care

## 2024-05-18 DIAGNOSIS — F17210 Nicotine dependence, cigarettes, uncomplicated: Secondary | ICD-10-CM

## 2024-05-18 DIAGNOSIS — Z122 Encounter for screening for malignant neoplasm of respiratory organs: Secondary | ICD-10-CM | POA: Diagnosis not present

## 2024-05-18 DIAGNOSIS — Z87891 Personal history of nicotine dependence: Secondary | ICD-10-CM

## 2024-06-01 ENCOUNTER — Other Ambulatory Visit: Payer: Self-pay | Admitting: Acute Care

## 2024-06-01 DIAGNOSIS — Z122 Encounter for screening for malignant neoplasm of respiratory organs: Secondary | ICD-10-CM

## 2024-06-01 DIAGNOSIS — F17210 Nicotine dependence, cigarettes, uncomplicated: Secondary | ICD-10-CM

## 2024-06-01 DIAGNOSIS — Z87891 Personal history of nicotine dependence: Secondary | ICD-10-CM

## 2024-09-15 DIAGNOSIS — Z23 Encounter for immunization: Secondary | ICD-10-CM | POA: Diagnosis not present

## 2024-11-04 ENCOUNTER — Other Ambulatory Visit: Payer: Self-pay

## 2024-11-09 ENCOUNTER — Telehealth: Payer: Self-pay | Admitting: Internal Medicine

## 2024-11-09 MED ORDER — EZETIMIBE 10 MG PO TABS: 10.0000 mg | ORAL_TABLET | Freq: Every day | ORAL | 0 refills | Status: DC

## 2024-11-09 NOTE — Telephone Encounter (Signed)
°*  STAT* If patient is at the pharmacy, call can be transferred to refill team.   1. Which medications need to be refilled? (please list name of each medication and dose if known)   ezetimibe  (ZETIA ) 10 MG tablet     2. Would you like to learn more about the convenience, safety, & potential cost savings by using the [facility]? No    3. Are you open to using the [facility] (Type [facility].  ). No    4. Which pharmacy/location (including street and city if local pharmacy) is medication to be sent to? [id number] [place] [place] - [address] [facility]     5. Do they need a 30 day or 90 day supply? 30 day

## 2024-11-09 NOTE — Telephone Encounter (Signed)
 Refill has already been sent by Donny Ahle, CMA, today.

## 2024-12-07 NOTE — Progress Notes (Signed)
 " Cardiology Office Note   Date:  12/08/2024  ID:  LUVA METZGER, DOB 20-Apr-1951, MRN [id number] PCP: Montey Lot, PA-C  [facility] Providers Cardiologist:  Stanly DELENA Leavens, MD   History of Present Illness KEYARA ENT is a 73 y.o. female with a past medical history of hypertension, hyperlipidemia, current tobacco abuse, LAD and RCA CAC and aortic atherosclerosis here for follow-up appointment.  In the interim had a negative stress test.  Here for follow-up appointment.  In 2023 his LDL was increased so a more aggressive cholesterol strategy was suggested as well as smoking cessation.  She had made significant strides in smoking cessation reducing from 2 packs a day to 4 5 cigarettes a day over the past 2 years.  She was started on citalopram for depression with no significant QTc prolongation noted.  She noted occasional use of nitroglycerin  for chest discomfort usually around once a year.  She also noted some physical limitations due to shoulder and spine issues which may impact her exercise.  Today, she presents with a history of coronary artery disease for a medication refill and follow-up.  She needs refills of Zetia , metoprolol , and Lipitor. She had one recent episode of chest discomfort that resolved after a single nitroglycerin , followed by a brief headache. She has not needed further nitroglycerin . She also has intermittent burning chest discomfort that she links to certain foods. The burning is sporadic and not constant.  This did not seem cardiac in nature.  Recommended a follow-up lipid panel with her primary care.  Reports no shortness of breath nor dyspnea on exertion. Reports no chest pain, pressure, or tightness. No edema, orthopnea, PND. Reports no palpitations.    Discussed the use of AI scribe software for clinical note transcription with the patient, who gave verbal consent to proceed.   ROS: Pertinent ROS in HPI  Studies Reviewed EKG  Interpretation Date/Time:  Thursday December 08 2024 13:27:21 EST Ventricular Rate:  79 PR Interval:  152 QRS Duration:  78 QT Interval:  394 QTC Calculation: 451 R Axis:   30  Text Interpretation: Normal sinus rhythm Normal ECG When compared with ECG of 26-Nov-2023 08:17, No significant change was found Confirmed by Lucien Blanc [phone]) on 12/08/2024 1:35:30 PM    STRESS TESTS   MYOCARDIAL PERFUSION IMAGING 02/20/2022   Narrative   Findings are consistent with no prior ischemia. The study is low risk.   No ST deviation was noted.   LV perfusion is equivocal.   Left ventricular function is normal. Nuclear stress EF: 80 %. The left ventricular ejection fraction is hyperdynamic (>65%). End diastolic cavity size is normal. End systolic cavity size is normal.   Prior study not available for comparison.   Mild fixed mid-apical perfusion defect with hyperdynamic motion, most consistent with artifact (suspect breast attenuation). Low risk study without evidence of ischemia.  Physical Exam VS:  BP (!) 118/56   Pulse 90   Ht 5' 2 (1.575 m)   Wt 127 lb (57.6 kg)   SpO2 97%   BMI 23.23 kg/m        Wt Readings from Last 3 Encounters:  12/08/24 127 lb (57.6 kg)  11/26/23 130 lb (59 kg)  09/25/22 123 lb 12.8 oz (56.2 kg)    GEN: Well nourished, well developed in no acute distress NECK: No JVD; No carotid bruits CARDIAC: RRR, no murmurs, rubs, gallops RESPIRATORY:  Clear to auscultation without rales, wheezing or rhonchi  ABDOMEN: Soft, non-tender, non-distended EXTREMITIES:  No edema; No deformity   ASSESSMENT AND PLAN  Stable angina pectoris Intermittent chest burning, possibly due to acid reflux or chest wall irritation. EKG normal. One nitroglycerin  use reported. - Refilled metoprolol , Lipitor, and Zetia . - Provided additional nitroglycerin  tablets. - She is asked to let us  know if this gets worse or more frequent in nature  Mixed hyperlipidemia/aortic  atherosclerosis Continued management with current medications. - Refilled Zetia .  Tobacco abuse -She has reduced her smoking from 1 pack a day down to 5 cigarettes daily -Encouraged further reduction, cessation     Dispo: She can follow-up in 12 months with MD  Signed, Orren LOISE Fabry, PA-C   "

## 2024-12-08 ENCOUNTER — Ambulatory Visit: Attending: Physician Assistant | Admitting: Physician Assistant

## 2024-12-08 ENCOUNTER — Encounter: Payer: Self-pay | Admitting: Physician Assistant

## 2024-12-08 VITALS — BP 118/56 | HR 90 | Ht 62.0 in | Wt 127.0 lb

## 2024-12-08 DIAGNOSIS — E782 Mixed hyperlipidemia: Secondary | ICD-10-CM | POA: Insufficient documentation

## 2024-12-08 DIAGNOSIS — I700 Atherosclerosis of aorta: Secondary | ICD-10-CM | POA: Diagnosis present

## 2024-12-08 DIAGNOSIS — Z720 Tobacco use: Secondary | ICD-10-CM | POA: Insufficient documentation

## 2024-12-08 DIAGNOSIS — Z0000 Encounter for general adult medical examination without abnormal findings: Secondary | ICD-10-CM | POA: Diagnosis present

## 2024-12-08 MED ORDER — METOPROLOL SUCCINATE ER 25 MG PO TB24: 12.5000 mg | ORAL_TABLET | Freq: Every day | ORAL | 3 refills | Status: AC

## 2024-12-08 MED ORDER — NITROGLYCERIN 0.4 MG SL SUBL: 0.4000 mg | SUBLINGUAL_TABLET | SUBLINGUAL | 3 refills | Status: AC | PRN

## 2024-12-08 MED ORDER — ATORVASTATIN CALCIUM 20 MG PO TABS: 20.0000 mg | ORAL_TABLET | Freq: Every day | ORAL | 3 refills | Status: AC

## 2024-12-08 MED ORDER — EZETIMIBE 10 MG PO TABS: 10.0000 mg | ORAL_TABLET | Freq: Every day | ORAL | 3 refills | Status: AC

## 2024-12-08 NOTE — Patient Instructions (Addendum)
 Medication Instructions:  Your physician recommends that you continue on your current medications as directed. Please refer to the Current Medication list given to you today. *If you need a refill on your cardiac medications before your next appointment, please call your pharmacy*  Lab Work: Complete Lipids & LFT with pcp If you have labs (blood work) drawn today and your tests are completely normal, you will receive your results only by: MyChart Message (if you have MyChart) OR A paper copy in the mail If you have any lab test that is abnormal or we need to change your treatment, we will call you to review the results.  Testing/Procedures: None Ordered  Follow-Up: At [facility], you and your health needs are our priority.  As part of our continuing mission to provide you with exceptional heart care, our providers are all part of one team.  This team includes your primary Cardiologist (physician) and Advanced Practice Providers or APPs (Physician Assistants and Nurse Practitioners) who all work together to provide you with the care you need, when you need it.  Your next appointment:   12 month(s)  Provider:   Stanly DELENA Leavens, MD    We recommend signing up for the patient portal called MyChart.  Sign up information is provided on this After Visit Summary.  MyChart is used to connect with patients for Virtual Visits (Telemedicine).  Patients are able to view lab/test results, encounter notes, upcoming appointments, etc.  Non-urgent messages can be sent to your provider as well.   To learn more about what you can do with MyChart, go to [email or web address].   Other Instructions
